# Patient Record
Sex: Male | Born: 1970 | Race: Black or African American | Hispanic: No | Marital: Single | Smoking: Never smoker
Health system: Southern US, Community
[De-identification: ages and names within clinical notes are randomized; demographics above are authoritative.]

## PROBLEM LIST (undated history)

## (undated) DIAGNOSIS — E669 Obesity, unspecified: Secondary | ICD-10-CM

## (undated) DIAGNOSIS — R011 Cardiac murmur, unspecified: Secondary | ICD-10-CM

---

## 2000-05-04 ENCOUNTER — Emergency Department (HOSPITAL_COMMUNITY): Admission: EM | Admit: 2000-05-04 | Discharge: 2000-05-05 | Payer: Self-pay

## 2000-05-04 ENCOUNTER — Encounter: Payer: Self-pay | Admitting: Emergency Medicine

## 2001-07-07 ENCOUNTER — Emergency Department (HOSPITAL_COMMUNITY): Admission: EM | Admit: 2001-07-07 | Discharge: 2001-07-07 | Payer: Self-pay | Admitting: Emergency Medicine

## 2001-11-04 ENCOUNTER — Encounter: Payer: Self-pay | Admitting: Emergency Medicine

## 2001-11-04 ENCOUNTER — Emergency Department (HOSPITAL_COMMUNITY): Admission: EM | Admit: 2001-11-04 | Discharge: 2001-11-04 | Payer: Self-pay | Admitting: Emergency Medicine

## 2002-07-29 ENCOUNTER — Emergency Department (HOSPITAL_COMMUNITY): Admission: EM | Admit: 2002-07-29 | Discharge: 2002-07-30 | Payer: Self-pay | Admitting: Emergency Medicine

## 2002-07-29 ENCOUNTER — Encounter: Payer: Self-pay | Admitting: Emergency Medicine

## 2002-07-30 ENCOUNTER — Encounter: Payer: Self-pay | Admitting: Emergency Medicine

## 2003-07-30 ENCOUNTER — Emergency Department (HOSPITAL_COMMUNITY): Admission: EM | Admit: 2003-07-30 | Discharge: 2003-07-30 | Payer: Self-pay

## 2005-06-25 ENCOUNTER — Emergency Department (HOSPITAL_COMMUNITY): Admission: EM | Admit: 2005-06-25 | Discharge: 2005-06-25 | Payer: Self-pay | Admitting: Emergency Medicine

## 2007-04-06 ENCOUNTER — Emergency Department (HOSPITAL_COMMUNITY): Admission: EM | Admit: 2007-04-06 | Discharge: 2007-04-06 | Payer: Self-pay | Admitting: Emergency Medicine

## 2007-09-07 ENCOUNTER — Emergency Department (HOSPITAL_COMMUNITY): Admission: EM | Admit: 2007-09-07 | Discharge: 2007-09-08 | Payer: Self-pay | Admitting: Emergency Medicine

## 2007-09-08 ENCOUNTER — Emergency Department (HOSPITAL_COMMUNITY): Admission: EM | Admit: 2007-09-08 | Discharge: 2007-09-09 | Payer: Self-pay | Admitting: Emergency Medicine

## 2010-08-13 ENCOUNTER — Emergency Department (HOSPITAL_COMMUNITY)
Admission: EM | Admit: 2010-08-13 | Discharge: 2010-08-13 | Disposition: A | Discharge status: Home / Self Care | Payer: Self-pay | Attending: Emergency Medicine | Admitting: Emergency Medicine

## 2010-08-13 ENCOUNTER — Emergency Department (HOSPITAL_COMMUNITY): Payer: Self-pay

## 2010-08-13 DIAGNOSIS — M25569 Pain in unspecified knee: Secondary | ICD-10-CM | POA: Insufficient documentation

## 2010-08-13 DIAGNOSIS — M25469 Effusion, unspecified knee: Secondary | ICD-10-CM | POA: Insufficient documentation

## 2011-01-15 LAB — COMPREHENSIVE METABOLIC PANEL
ALT: 29
Albumin: 3.1 — ABNORMAL LOW
Alkaline Phosphatase: 69
Alkaline Phosphatase: 75
BUN: 10
BUN: 11
CO2: 24
CO2: 29
Chloride: 101
Chloride: 105
Creatinine, Ser: 1.17
GFR calc non Af Amer: >60
Glucose, Bld: 109 — ABNORMAL HIGH
Glucose, Bld: 134 — ABNORMAL HIGH
Potassium: 3.6
Potassium: 3.9
Sodium: 133 — ABNORMAL LOW
Total Bilirubin: 1.2
Total Bilirubin: 1.3 — ABNORMAL HIGH

## 2011-01-15 LAB — CBC
HCT: 40.8
HCT: 44.3
Hemoglobin: 13.6
Hemoglobin: 14.4
MCHC: 33.4
Platelets: 178
RBC: 5.52
RDW: 13.9
WBC: 10.5

## 2011-01-15 LAB — DIFFERENTIAL
Basophils Absolute: 0
Basophils Absolute: 0.1
Basophils Relative: 0
Basophils Relative: 1
Eosinophils Absolute: 0.3
Eosinophils Relative: 4
Lymphocytes Relative: 23
Monocytes Absolute: 0.7
Neutro Abs: 7.1
Neutrophils Relative %: 67

## 2011-01-15 LAB — URINALYSIS, ROUTINE W REFLEX MICROSCOPIC
Bilirubin Urine: NEGATIVE
Bilirubin Urine: NEGATIVE
Glucose, UA: NEGATIVE
Ketones, ur: NEGATIVE
Ketones, ur: NEGATIVE
Nitrite: NEGATIVE
Nitrite: NEGATIVE
Protein, ur: NEGATIVE
Protein, ur: NEGATIVE
Urobilinogen, UA: 1
pH: 5.5

## 2011-01-15 LAB — D-DIMER, QUANTITATIVE: D-Dimer, Quant: 0.62 — ABNORMAL HIGH

## 2011-01-15 LAB — URINE MICROSCOPIC-ADD ON

## 2011-07-23 ENCOUNTER — Emergency Department (HOSPITAL_COMMUNITY)
Admission: EM | Admit: 2011-07-23 | Discharge: 2011-07-23 | Disposition: A | Discharge status: Home / Self Care | Payer: Self-pay | Attending: Emergency Medicine | Admitting: Emergency Medicine

## 2011-07-23 ENCOUNTER — Emergency Department (HOSPITAL_COMMUNITY): Payer: Self-pay

## 2011-07-23 ENCOUNTER — Encounter (HOSPITAL_COMMUNITY): Payer: Self-pay | Admitting: Emergency Medicine

## 2011-07-23 DIAGNOSIS — M1710 Unilateral primary osteoarthritis, unspecified knee: Secondary | ICD-10-CM | POA: Insufficient documentation

## 2011-07-23 DIAGNOSIS — IMO0002 Reserved for concepts with insufficient information to code with codable children: Secondary | ICD-10-CM | POA: Insufficient documentation

## 2011-07-23 DIAGNOSIS — G8929 Other chronic pain: Secondary | ICD-10-CM | POA: Insufficient documentation

## 2011-07-23 DIAGNOSIS — M25462 Effusion, left knee: Secondary | ICD-10-CM

## 2011-07-23 DIAGNOSIS — M25469 Effusion, unspecified knee: Secondary | ICD-10-CM | POA: Insufficient documentation

## 2011-07-23 DIAGNOSIS — M25569 Pain in unspecified knee: Secondary | ICD-10-CM | POA: Insufficient documentation

## 2011-07-23 HISTORY — DX: Obesity, unspecified: E66.9

## 2011-07-23 MED ORDER — MELOXICAM 7.5 MG PO TABS: 7.5000 mg | ORAL_TABLET | Freq: Every day | ORAL | Status: DC
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
## 2011-07-23 NOTE — Progress Notes (Signed)
Orthopedic Tech Progress Note Patient Details:  Ian Sparks [facility] [birth year] [id number]  Other Ortho Devices Type of Ortho Device: Knee Sleeve Ortho Device Interventions: Application   Cammer, Mickie Bail 07/23/2011, 10:22 AM

## 2011-07-23 NOTE — ED Notes (Signed)
Ortho tech paged  

## 2011-07-23 NOTE — Discharge Instructions (Signed)
Please read the information below.  Follow the RICE instructions detailed below.  If you continue to have problems with your knees, please call the orthopedist listed above.  You may also use the resources below to find a primary care provider for follow up.  You may return to the ER at any time for worsening condition or any new symptoms that concern you.  Arthralgia Arthralgia is joint pain. A joint is a place where two bones meet. Joint pain can happen for many reasons. The joint can be bruised, stiff, infected, or weak from aging. Pain usually goes away after resting and taking medicine for soreness.  HOME CARE  Rest the joint as told by your doctor.   Keep the sore joint raised (elevated) for the first 24 hours.   Put ice on the joint area.   Put ice in a plastic bag.   Place a towel between your skin and the bag.   Leave the ice on for 15 to 20 minutes, 3 to 4 times a day.   Wear your splint, casting, elastic bandage, or sling as told by your doctor.   Only take medicine as told by your doctor. Do not take aspirin.   Use crutches as told by your doctor. Do not put weight on the joint until told to by your doctor.  GET HELP RIGHT AWAY IF:   You have bruising, puffiness (swelling), or more pain.   Your fingers or toes turn blue or start to lose feeling (numb).   Your medicine does not lessen the pain.   Your pain becomes severe.   You have a temperature by mouth above 102 F (38.9 C), not controlled by medicine.   You cannot move or use the joint.  MAKE SURE YOU:   Understand these instructions.   Will watch your condition.   Will get help right away if you are not doing well or get worse.  Document Released: 03/26/2009 Document Revised: 03/27/2011 Document Reviewed: 03/26/2009 [facility] Patient Information 2012 [place], [place].  Arthritis, Nonspecific Arthritis is inflammation of a joint. This usually means pain, redness, warmth or swelling are present. One or more  joints may be involved. There are a number of types of arthritis. Your caregiver may not be able to tell what type of arthritis you have right away. CAUSES  The most common cause of arthritis is the wear and tear on the joint (osteoarthritis). This causes damage to the cartilage, which can break down over time. The knees, hips, back and neck are most often affected by this type of arthritis. Other types of arthritis and common causes of joint pain include:  Sprains and other injuries near the joint. Sometimes minor sprains and injuries cause pain and swelling that develop hours later.   Rheumatoid arthritis. This affects hands, feet and knees. It usually affects both sides of your body at the same time. It is often associated with chronic ailments, fever, weight loss and general weakness.   Crystal arthritis. Gout and pseudo gout can cause occasional acute severe pain, redness and swelling in the foot, ankle, or knee.   Infectious arthritis. Bacteria can get into a joint through a break in overlying skin. This can cause infection of the joint. Bacteria and viruses can also spread through the blood and affect your joints.   Drug, infectious and allergy reactions. Sometimes joints can become mildly painful and slightly swollen with these types of illnesses.  SYMPTOMS   Pain is the main symptom.  Your joint or joints can also be red, swollen and warm or hot to the touch.   You may have a fever with certain types of arthritis, or even feel overall ill.   The joint with arthritis will hurt with movement. Stiffness is present with some types of arthritis.  DIAGNOSIS  Your caregiver will suspect arthritis based on your description of your symptoms and on your exam. Testing may be needed to find the type of arthritis:  Blood and sometimes urine tests.   X-ray tests and sometimes CT or MRI scans.   Removal of fluid from the joint (arthrocentesis) is done to check for bacteria, crystals or other  causes. Your caregiver (or a specialist) will numb the area over the joint with a local anesthetic, and use a needle to remove joint fluid for examination. This procedure is only minimally uncomfortable.   Even with these tests, your caregiver may not be able to tell what kind of arthritis you have. Consultation with a specialist (rheumatologist) may be helpful.  TREATMENT  Your caregiver will discuss with you treatment specific to your type of arthritis. If the specific type cannot be determined, then the following general recommendations may apply. Treatment of severe joint pain includes:  Rest.   Elevation.   Anti-inflammatory medication (for example, ibuprofen) may be prescribed. Avoiding activities that cause increased pain.   Only take over-the-counter or prescription medicines for pain and discomfort as recommended by your caregiver.   Cold packs over an inflamed joint may be used for 10 to 15 minutes every hour. Hot packs sometimes feel better, but do not use overnight. Do not use hot packs if you are diabetic without your caregiver's permission.   A cortisone shot into arthritic joints may help reduce pain and swelling.   Any acute arthritis that gets worse over the next 1 to 2 days needs to be looked at to be sure there is no joint infection.  Long-term arthritis treatment involves modifying activities and lifestyle to reduce joint stress jarring. This can include weight loss. Also, exercise is needed to nourish the joint cartilage and remove waste. This helps keep the muscles around the joint strong. HOME CARE INSTRUCTIONS   Do not take aspirin to relieve pain if gout is suspected. This elevates uric acid levels.   Only take over-the-counter or prescription medicines for pain, discomfort or fever as directed by your caregiver.   Rest the joint as much as possible.   If your joint is swollen, keep it elevated.   Use crutches if the painful joint is in your leg.   Drinking  plenty of fluids may help for certain types of arthritis.   Follow your caregiver's dietary instructions.   Try low-impact exercise such as:   Swimming.   Water aerobics.   Biking.   Walking.   Morning stiffness is often relieved by a warm shower.   Put your joints through regular range-of-motion.  SEEK MEDICAL CARE IF:   You do not feel better in 24 hours or are getting worse.   You have side effects to medications, or are not getting better with treatment.  SEEK IMMEDIATE MEDICAL CARE IF:   You have a fever.   You develop severe joint pain, swelling or redness.   Many joints are involved and become painful and swollen.   There is severe back pain and/or leg weakness.   You have loss of bowel or bladder control.  Document Released: 05/15/2004 Document Revised: 03/27/2011 Document Reviewed: 05/31/2008  Exit[employer] Patient Information 2012 [place], [place].  If you have no primary doctor, here are some resources that may be helpful:  Medicaid-accepting [facility] Providers:   - [facility]- [address]      [phone]      Mon-Fri 9am-7pm, Sat 9am-1pm   - [facility]- [address] [place]      [phone]   - [facility]- [address], [address] [place]      [phone]   [facility]- [address]      [phone]   - Renaye Rakers- [address] [place], [address]      [phone]      Only accepts [place] patients       after they have her name applied to their card   Self Pay (no insurance) in [place]:   - Sickle Cell Patients: Dr Willey Blade, [facility] Internal Medicine      [address] [place]      [phone]   - Health [phone]   - Physician Referral Service- [phone]   - [facility]- [address] [place]      [phone]   [facility] [place]- [address]   - [facility]- see  information above      (Speak to [employer] if you do not have insurance)   - [facility]- [address] [place]      [phone]   - [facility] [place]- [address]      [phone]   - [facility]- [address]      [phone]   - Dr Julio Sicks-  [address], [place]      [phone]   - [facility]- [address]      [phone]   - [facility]- [address]      [phone]      Also [address]      [phone]   - [facility]- [address]      [phone]      1st and 3rd Saturday every month, 10am-1pm Other agencies that provide inexpensive medical care:    [facility]  [phone]    [facility]  [phone]    [facility]  [phone]    [facility]  [phone]    [facility]  [phone]  General Information: Finding a doctor when you do not have health insurance can be tricky. Although you are not limited by an insurance plan, you are of course limited by her finances and how much but he can pay out of pocket.  What are your options if you don't have health insurance?   1) Find a Librarian, academic and Pay Out of Pocket Although you won't have to find out who is covered by your insurance plan, it is a good idea to ask around and get recommendations. You will then need to call the office and see if the doctor you have chosen will accept you as a new patient and what types of options they offer for patients who are self-pay. Some doctors offer discounts or will set up payment plans for their patients who do not have insurance, but you will need to ask so you aren't  surprised when you get to your appointment.  2) Contact Your Local Health Department Not all health departments have doctors that can see patients for sick visits, but many do, so it is worth a call to see if yours does. If you don't know where your local health department is, you can check in your phone  book. The CDC also has a tool to help you locate your state's health department, and many state websites also have listings of all of their local health departments.  3) Find a Walk-in Clinic If your illness is not likely to be very severe or complicated, you may want to try a walk in clinic. These are popping up all over the country in pharmacies, drugstores, and shopping centers. They're usually staffed by nurse practitioners or physician assistants that have been trained to treat common illnesses and complaints. They're usually fairly quick and inexpensive. However, if you have serious medical issues or chronic medical problems, these are probably not your best option   RESOURCE GUIDE  Dental Problems  Patients with Medicaid: [facility] [phone] [address]                                           [phone] [address] Phone:  [phone]                                                  Phone:  [phone]  If unable to pay or uninsured, contact:  [facility] or [facility]. to become qualified for the adult dental clinic.  Chronic Pain Problems Contact [facility]  [phone] Patients need to be referred by their primary care doctor.  Insufficient Money for Medicine Contact [employer]:  call "211" or [facility] [phone].  No Primary Care Doctor Call [facility]  [phone] Other agencies that provide inexpensive medical care    [facility]  [phone]    [facility] Internal [facility]  [phone]    [facility]  [phone]    [facility]  [phone]    [facility]  [phone]    [facility]  [phone]  [facility] Behavioral Health  [phone] [facility]  [phone] [facility] Mental Health   [phone] (emergency services [phone])  Substance Abuse Resources [facility]  (763)098-0514 [facility] [phone] [facility] [place] 2518170494 Floydene Flock (949)080-1594 Residential & Outpatient Substance Abuse Program  684-864-1630  Abuse/Neglect [facility] Child Abuse Hotline (678)465-4669 [facility] Child Abuse Hotline 530-458-5450 (After Hours)  Emergency Shelter [facility] [phone]  [facility] [place] [facility] [place] 210-705-1214 [facility] (317)315-1835  MRSA Hotline #:   (802)780-5939    [facility] Resources  [facility] [place]     United Way                          [facility] Dept. [address] [place]  [address]      [address]  Blondell Reveal Phone:  [phone]                                   Phone:  [phone]                 Phone:  [phone]  [facility] Mental Health Phone:  [phone]  [facility] Child Abuse Hotline (872)518-1412 949-386-6692 (After Hours)  Arthritis, Nonspecific Arthritis is inflammation of a joint. This usually means pain, redness, warmth or swelling are present. One or more joints may be involved. There are a number of types of arthritis. Your caregiver may not be able to tell what type of arthritis you have right away. CAUSES  The most common cause of arthritis is the wear and tear on the joint (osteoarthritis). This causes damage to the cartilage, which can break down over time. The knees, hips, back and neck are most often affected by this type of arthritis. Other types of arthritis and common causes of joint pain include:  Sprains and other injuries near the joint. Sometimes minor sprains and injuries cause pain and swelling that develop hours later.   Rheumatoid arthritis. This affects hands, feet and knees. It usually affects both sides of your body at the same time. It is often associated with  chronic ailments, fever, weight loss and general weakness.   Crystal arthritis. Gout and pseudo gout can cause occasional acute severe pain, redness and swelling in the foot, ankle, or knee.   Infectious arthritis. Bacteria can get into a joint through a break in overlying skin. This can cause infection of the joint. Bacteria and viruses can also spread through the blood and affect your joints.   Drug, infectious and allergy reactions. Sometimes joints can become mildly painful and slightly swollen with these types of illnesses.  SYMPTOMS   Pain is the main symptom.   Your joint or joints can also be red, swollen and warm or hot to the touch.   You may have a fever with certain types of arthritis, or even feel overall ill.   The joint with arthritis will hurt with movement. Stiffness is present with some types of arthritis.  DIAGNOSIS  Your caregiver will suspect arthritis based on your description of your symptoms and on your exam. Testing may be needed to find the type of arthritis:  Blood and sometimes urine tests.   X-ray tests and sometimes CT or MRI scans.   Removal of fluid from the joint (arthrocentesis) is done to check for bacteria, crystals or other causes. Your caregiver (or a specialist) will numb the area over the joint with a local anesthetic, and use a needle to remove joint fluid for examination. This procedure is only minimally uncomfortable.   Even with these tests, your caregiver may not be able to tell what kind of arthritis you have. Consultation with a specialist (rheumatologist) may be helpful.  TREATMENT  Your caregiver will discuss with you treatment specific to your type of arthritis. If the specific type cannot be determined, then the following general recommendations may apply. Treatment of severe joint pain includes:  Rest.   Elevation.   Anti-inflammatory medication (for example, ibuprofen) may be prescribed. Avoiding activities that cause increased  pain.   Only take over-the-counter or prescription medicines for pain and discomfort as recommended by your caregiver.   Cold packs over an inflamed joint may be used for 10 to 15 minutes every hour. Hot packs sometimes feel better, but do not use overnight. Do not use hot packs if you are diabetic without your caregiver's permission.   A cortisone shot into arthritic joints may help reduce pain and swelling.   Any acute arthritis that gets worse over the next 1 to 2 days needs to be looked at to be sure there is no joint infection.  Long-term arthritis treatment involves modifying activities and lifestyle to reduce joint stress jarring. This can include weight loss. Also, exercise is needed to nourish the joint cartilage and remove waste. This helps keep the muscles around the joint strong. HOME CARE INSTRUCTIONS   Do not take aspirin to relieve pain if gout is suspected. This elevates uric acid levels.   Only take over-the-counter or prescription medicines for pain, discomfort or fever as directed by your caregiver.   Rest the joint as much as possible.   If your joint is swollen, keep it elevated.   Use crutches if the painful joint is in your leg.   Drinking plenty of fluids may help for certain types of arthritis.   Follow your caregiver's dietary instructions.   Try low-impact exercise such as:   Swimming.   Water aerobics.   Biking.   Walking.   Morning stiffness is often relieved by a warm shower.   Put your joints through regular range-of-motion.  SEEK MEDICAL CARE IF:   You do not feel better in 24 hours or are getting worse.   You have side effects to medications, or are not getting better with treatment.  SEEK IMMEDIATE MEDICAL CARE IF:   You have a fever.   You develop severe joint pain, swelling or redness.   Many joints are involved and become painful and swollen.   There is severe back pain and/or leg weakness.   You have loss of bowel or bladder  control.  Document Released: 05/15/2004 Document Revised: 03/27/2011 Document Reviewed: 05/31/2008 [facility] Patient Information 2012 [place], [place].

## 2011-07-23 NOTE — ED Provider Notes (Signed)
Medical screening examination/treatment/procedure(s) were performed by non-physician practitioner and as supervising physician I was immediately available for consultation/collaboration.  Ethelda Chick, MD 07/23/11 1054

## 2011-07-23 NOTE — ED Notes (Signed)
Left knee pain  X 1 1/2 months has not seen a dr hurts to walk and pain was bad last night no recent injury

## 2012-04-08 ENCOUNTER — Encounter (HOSPITAL_COMMUNITY): Payer: Self-pay | Admitting: *Deleted

## 2012-04-08 ENCOUNTER — Emergency Department (HOSPITAL_COMMUNITY)
Admission: EM | Admit: 2012-04-08 | Discharge: 2012-04-08 | Disposition: A | Discharge status: Home / Self Care | Payer: Self-pay | Attending: Emergency Medicine | Admitting: Emergency Medicine

## 2012-04-08 ENCOUNTER — Emergency Department (HOSPITAL_COMMUNITY): Payer: Self-pay

## 2012-04-08 DIAGNOSIS — G8929 Other chronic pain: Secondary | ICD-10-CM | POA: Insufficient documentation

## 2012-04-08 DIAGNOSIS — E669 Obesity, unspecified: Secondary | ICD-10-CM | POA: Insufficient documentation

## 2012-04-08 DIAGNOSIS — M25519 Pain in unspecified shoulder: Secondary | ICD-10-CM | POA: Insufficient documentation

## 2012-04-08 DIAGNOSIS — M25569 Pain in unspecified knee: Secondary | ICD-10-CM | POA: Insufficient documentation

## 2012-04-08 MED ORDER — HYDROMORPHONE HCL PF 1 MG/ML IJ SOLN
1.0000 mg | Freq: Once | INTRAMUSCULAR | Status: AC
  Administered 2012-04-08: 1 mg via INTRAMUSCULAR
  Filled 2012-04-08: qty 1

## 2012-04-08 MED ORDER — HYDROCODONE-ACETAMINOPHEN 5-325 MG PO TABS: 2.0000 | ORAL_TABLET | Freq: Once | ORAL | Status: DC

## 2012-04-08 MED ORDER — MELOXICAM 7.5 MG PO TABS: 7.5000 mg | ORAL_TABLET | Freq: Every day | ORAL | Status: DC

## 2012-04-08 NOTE — ED Notes (Signed)
Patient refusing Vicodin. States "Dilaudid only works for me". PA notified

## 2012-04-08 NOTE — ED Notes (Signed)
Pt fell 1.5 weeks ago, c/o right knee pain and right arm pain from fall.
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
## 2012-04-08 NOTE — Discharge Instructions (Signed)
Chronic Pain Management Managing chronic pain is not easy. The goal is to provide as much pain relief as possible. There are emotional as well as physical problems. Chronic pain may lead to symptoms of depression which magnify those of the pain. Problems may include:  Anxiety.  Sleep disturbances.  Confused thinking.  Feeling cranky.  Fatigue.  Weight gain or loss. Identify the source of the pain first, if possible. The pain may be masking another problem. Try to find a pain management specialist or clinic. Work with a team to create a treatment plan for you. MEDICATIONS  May include narcotics or opioids. Larger than normal doses may be needed to control your pain.  Drugs for depression may help.  Over-the-counter medicines may help for some conditions. These drugs may be used along with others for better pain relief.  May be injected into sites such as the spine and joints. Injections may have to be repeated if they wear off. THERAPY MAY INCLUDE:  Working with a physical therapist to keep from getting stiff.  Regular, gentle exercise.  Cognitive or behavioral therapy.  Using complementary or integrative medicine such as:  Acupuncture.  Massage, Reiki, or Rolfing.  Aroma, color, light, or sound therapy.  Group support. FOR MORE INFORMATION [email or web address]. American Chronic Pain Association [email or web address]. Document Released: 05/15/2004 Document Revised: 06/30/2011 Document Reviewed: 06/24/2007 [facility] Patient Information 2013 [place], [place].  Please take medications as directed.  Please follow up with a Primary Care Provider or Specialist as stated above.  If you do not have a primary care doctor, please see the list below.  Please return to the emergency department or follow up with your doctor if you have: - Worsening symptoms - Fever >102 - Chest Pain - Difficulty Breathing or Shortness of Breath  Affordable Health Care Below is  information to help you research and register for Affordable Health Care that was started on January 20, 2012. Keep in mind when comparing different insurance plan prices that you may qualify for for financial assistance to lower your costs  The same application will let you find out if you and your family members might qualify for free or low-cost coverage available through Medicaid or the [employer] Program (CHIP).  www.http://www.long-jenkins.com/: Where individuals can learn about the Marketplace and the upcoming benefits (including where they can find local assistance), or be connected to appropriate resources in states that are running their own Marketplace.  Health Insurance Marketplace Call Center: If you have questions, call ([phone]. TTY users should call [phone], Open 24 hours a day, 7 days a week.   ePompanoBeach.com.br: Where to get the latest resources to help spread the word about Affordable Health Care & the Marketplace. This website also has Health visitor in many different languages.    More information:   Health insurance companies selling plans through the Marketplace cant deny you coverage or charge you more due to pre-existing health conditions, and they cant charge women and men different premiums based on their gender.  The information is all available online, but you can apply 4 ways: online, by phone, by mail, or in-person with the help of a trained assister or navigator.   Each health plan will generally offer comprehensive coverage, including a core set of essential health benefits like doctor visits, preventive care, maternity care, hospitalization, prescription drugs, and more.   No matter where you live, there will be a Marketplace in your state, offering plans from private companies where youll be able to compare your  health coverage options based on price, benefits, quality, and other features important to you before you make a choice.    RESOURCE GUIDE  Chronic Pain Problems: Contact [facility]  [phone] Patients need to be referred by their primary care doctor.  Insufficient Money for Medicine: Contact United Way:  call "211."   No Primary Care Doctor: - Call Health Connect  [phone] - can help you locate a primary care doctor that  accepts your insurance, provides certain services, etc. - Physician Referral Service- [phone]  Agencies that provide inexpensive medical care: - [facility]  [phone] - [facility]  [phone] - [facility]  [phone] - Women's Clinic  [phone] - Planned Parenthood  [phone] - [facility]  [phone]  Medicaid-accepting [facility] Providers: - [facility]- [address]  [phone], Mon-Fri 9am-7pm, Sat 9am-1pm - [facility]- [address] [place], Suite [place]  [phone] - [facility]- [address], [address] [place]  [phone] [facility]- [address]  [phone] - Renaye Rakers- [address] [place], [address], [phone]  Only accepts [place] patients after they have their name  applied to their card  Self Pay (no insurance) in [place]: - Sickle Cell Patients: Dr Willey Blade, [facility] Internal Medicine  [address] [place], [phone] - [facility]- [address] [place]  [phone]       [facility] [place]- [address]       -     [facility]- see information above (Speak to [employer] if you do not have insurance)       -  [facility]- [address] [place],  [phone]       -  [facility]- [address], [phone]       -  Dr Julio Sicks-  [address], [place], [phone]       -  Urgent Medical and [facility] - [address] [phone]       -  [facility]- [address]  [phone], also [address] [phone]       -    [facility]- [address] [place], [phone], 1st & 3rd Saturday        every month, 10am-1pm  1) Find a Doctor and Pay Out of Pocket Although you won't have to find out who is covered by your insurance plan, it is a good idea to ask around and get recommendations. You will then need to call the office and see if the doctor you have chosen will accept you as a new patient and what types of options they offer for patients who are self-pay. Some doctors offer discounts or will set up payment plans for their patients who do not have insurance, but you will need to ask so you aren't surprised when you get to your appointment.  2) Contact Your Local Health Department Not all health departments have doctors that can see patients for sick visits, but many do, so it is worth a call to see if yours does. If you don't know where your local health department is, you can check in your phone book. The CDC also has a tool to help you locate your state's  health department, and many state websites also have listings of all of their local health departments.  3) Find a Walk-in Clinic If your illness is not likely to be very severe or complicated, you may want to try a walk in clinic. These are popping up all over the country in pharmacies, drugstores, and shopping centers. They're usually staffed by nurse practitioners or physician assistants that have been trained to treat common illnesses and complaints. They're usually fairly quick and inexpensive. However, if you have serious medical issues or chronic medical problems, these are probably not your best option  STD Testing - [facility] [place], STD Clinic, [address], [place], phone [phone] or [phone].  Monday - Friday, call for an appointment. [facility] [employer], STD Clinic, [address], [place], phone [phone] or [phone].  Monday - Friday, call for an appointment.  Abuse/Neglect: [facility] Child Abuse Hotline 602-372-7111 [facility] Child Abuse Hotline (832)116-6861 (After Hours)  Emergency Shelter:  [facility] 712-631-5334  Maternity Homes: - Room at the [place] [facility] (774)169-6184 - [facility] [phone]  MRSA Hotline #:   (873)113-0215  [facility] Resources [facility] [place]  United Way [facility] Dept. [address]                 [address]         [address] [place] [address]  Blondell Reveal Phone:  [phone]                                  Phone:  [phone]                   Phone:  [phone]  [facility], [phone] - [facility] - [facility] [place]- [phone]       -     [facility] in [place], [address],             [phone], Insurance  [place] Child Abuse Hotline (431)226-9701 or 2264478716 (After Hours)  Dental Assistance  If unable to pay or uninsured, contact:  [facility] to become qualified for the adult dental clinic.  Patients with Medicaid: [facility] [phone] [address], [phone] [address] [phone]  If unable to pay, or uninsured, contact [facility] [phone] in [place], [phone] in [facility]) to become qualified for the adult dental clinic  Other Low-Cost Community Dental Services: - [facility]- [address] [place], [place], [place], [address], [phone], Ext. [address], 2nd and 4th Thursday of the month at 6:30am.  10 clients each day by appointment, can sometimes see walk-in patients if someone does not show for an appointment. [facility]- [address] [place]  [place], [place], [address], [phone] -  [facility]- [address], [place], [place], [address], [phone] [facility] Health Department- [phone] [facility] Health Department- [phone] [facility] Health [phone]       Behavioral Health Resources in the [facility]  Intensive Outpatient Programs: [facility]      [address] [place], [place] [phone] Both a day and evening program       [facility] Outpatient     [address]        [place], [place] [address] [phone]         ADS: [facility] Drug S[facility] [address] [place] [phone]  [facility] Mental Health ACCESS LINE: [phone] or [phone] [address] [place], [place] [address] [email or web address].[email or web address]  Behavioral Health Services  Substance Abuse Resources: - [facility] Drug Services  [phone] - [facility] [phone] - [facility] [place] [phone] Floydene Flock [phone] - Residential & Outpatient Substance Abuse Program  ([phone]  Psychological Services: [facility]  [phone] [facility]  [phone] - [facility], [phone] [place]. [address] [place], ACCESS LINE: 5157485199 or (808)[phone], EntrepreneurLoan.[email or web address].za  Mobile Crisis Teams:                                        Therapeutic Alternatives         Mobile Crisis Care Unit [phone]             [facility] [address] Ginette Otto [phone]                                         Interventionist [address] [place] [place] [phone]  Self-Help/Support Groups: Mental Health Assoc. of [employer] of support groups 980-129-9577 (call for more info)   Narcotics Anonymous (NA) Caring Services [address] [place] - 2 meetings  at this location  Residential Treatment Programs:  [facility]      [address]        [place]       [phone]         [facility] [address] [place] [address] [place], [place]  [address] [phone]  [facility]  [address] [place], [place] [address] [phone] Admissions: 8am-3pm M-F  [facility]     [address]        [place], [place] [address]       [phone]         [facility] [address] [place], [place] [phone]  The [facility] [address] [place], [place] [phone]  [facility] - Intensive Outpatient      [address]     [place], [place]       [phone]         [facility] (Addiction Recovery Care [facility]     [address] [place], [place] [phone] or [phone]  Residential Treatment Services (RTS), Medicaid [address] [place], [place] [phone]  [facility] Margo Aye  [address] [place] [phone]  [facility] Resources: [place]- [phone]               General Therapy                                                Angie Fava, PhD        [address] [place], [place] [address]         [phone]   Insurance  [facility] Behavioral   [address] [place], [place] [address] [phone]  [facility] [address] [place], [place] [address] [phone] Insurance/Medicaid/sponsorship through [facility] and Families                                              [address]                                        [place], [place] [address]    Therapy/tele-psych/case         [phone]          [facility] [address], [place]  [address]  Adolescent/group home/case management [phone]                                            Creola Corn PhD       General therapy       Insurance   [phone]         Dr. Lolly Mustache, Insurance, M-F [phone]

## 2012-04-08 NOTE — ED Provider Notes (Signed)
History/physical exam/procedure(s) were performed by non-physician practitioner and as supervising physician I was immediately available for consultation/collaboration. I have reviewed all notes and am in agreement with care and plan.   Hilario Quarry, MD 04/08/12 [phone]

## 2012-08-12 ENCOUNTER — Encounter (HOSPITAL_COMMUNITY): Payer: Self-pay | Admitting: Emergency Medicine

## 2012-08-12 ENCOUNTER — Emergency Department (HOSPITAL_COMMUNITY): Payer: Self-pay

## 2012-08-12 DIAGNOSIS — J029 Acute pharyngitis, unspecified: Secondary | ICD-10-CM | POA: Insufficient documentation

## 2012-08-12 DIAGNOSIS — R05 Cough: Secondary | ICD-10-CM | POA: Insufficient documentation

## 2012-08-12 DIAGNOSIS — R011 Cardiac murmur, unspecified: Secondary | ICD-10-CM | POA: Insufficient documentation

## 2012-08-12 DIAGNOSIS — R059 Cough, unspecified: Secondary | ICD-10-CM | POA: Insufficient documentation

## 2012-08-12 DIAGNOSIS — E669 Obesity, unspecified: Secondary | ICD-10-CM | POA: Insufficient documentation

## 2012-08-12 DIAGNOSIS — R52 Pain, unspecified: Secondary | ICD-10-CM | POA: Insufficient documentation

## 2012-08-12 DIAGNOSIS — R509 Fever, unspecified: Secondary | ICD-10-CM | POA: Insufficient documentation

## 2012-08-12 LAB — COMPREHENSIVE METABOLIC PANEL
ALT: 20 U/L (ref 0–53)
Albumin: 3.3 g/dL — ABNORMAL LOW (ref 3.5–5.2)
Alkaline Phosphatase: 74 U/L (ref 39–117)
Potassium: 3.7 mEq/L (ref 3.5–5.1)
Sodium: 138 mEq/L (ref 135–145)
Total Protein: 7.5 g/dL (ref 6.0–8.3)

## 2012-08-12 LAB — URINALYSIS, ROUTINE W REFLEX MICROSCOPIC
Bilirubin Urine: NEGATIVE
Glucose, UA: NEGATIVE mg/dL
Ketones, ur: NEGATIVE mg/dL
Leukocytes, UA: NEGATIVE
pH: 7 (ref 5.0–8.0)

## 2012-08-12 LAB — CBC WITH DIFFERENTIAL/PLATELET
Basophils Absolute: 0 10*3/uL (ref 0.0–0.1)
Basophils Relative: 0 % (ref 0–1)
Eosinophils Absolute: 0.1 10*3/uL (ref 0.0–0.7)
Hemoglobin: 13.5 g/dL (ref 13.0–17.0)
MCH: 27.1 pg (ref 26.0–34.0)
MCHC: 33.9 g/dL (ref 30.0–36.0)
Neutrophils Relative %: 70 % (ref 43–77)
Platelets: 132 10*3/uL — ABNORMAL LOW (ref 150–400)
WBC: 6.5 10*3/uL (ref 4.0–10.5)

## 2012-08-12 MED ORDER — ACETAMINOPHEN 325 MG PO TABS
650.0000 mg | ORAL_TABLET | Freq: Once | ORAL | Status: AC
  Administered 2012-08-12: 650 mg via ORAL
  Filled 2012-08-12: qty 2

## 2012-08-12 NOTE — ED Notes (Signed)
PT. REPORTS FEVER WITH CHILLS, BODY ACHES AND OCCASIONAL DRY COUGH ONSET YESTERDAY .

## 2012-08-13 ENCOUNTER — Emergency Department (HOSPITAL_COMMUNITY)
Admission: EM | Admit: 2012-08-13 | Discharge: 2012-08-13 | Disposition: A | Discharge status: Home / Self Care | Payer: Self-pay | Attending: Emergency Medicine | Admitting: Emergency Medicine

## 2012-08-13 HISTORY — DX: Cardiac murmur, unspecified: R01.1

## 2012-08-13 MED ORDER — KETOROLAC TROMETHAMINE 60 MG/2ML IM SOLN
60.0000 mg | Freq: Once | INTRAMUSCULAR | Status: AC
  Administered 2012-08-13: 60 mg via INTRAMUSCULAR
  Filled 2012-08-13: qty 2

## 2012-08-13 MED ORDER — BENZONATATE 100 MG PO CAPS: 200.0000 mg | ORAL_CAPSULE | Freq: Three times a day (TID) | ORAL | Status: DC

## 2012-08-13 MED ORDER — NAPROXEN 500 MG PO TABS: 500.0000 mg | ORAL_TABLET | Freq: Two times a day (BID) | ORAL | Status: DC

## 2012-08-13 MED ORDER — HYDROCODONE-ACETAMINOPHEN 5-325 MG PO TABS: 1.0000 | ORAL_TABLET | Freq: Four times a day (QID) | ORAL | Status: DC | PRN

## 2012-08-13 MED ORDER — BENZONATATE 100 MG PO CAPS
200.0000 mg | ORAL_CAPSULE | Freq: Once | ORAL | Status: AC
  Administered 2012-08-13: 200 mg via ORAL
  Filled 2012-08-13: qty 2

## 2012-08-16 NOTE — ED Provider Notes (Signed)
History     CSN: [id number]  Arrival date & time 08/12/12  2249   First MD Initiated Contact with Patient 08/13/12 [phone]      Chief Complaint  Patient presents with  . Fever  . Generalized Body Aches    HPI Ian Sparks is a 42 y.o. male presenting with 1 day of fever,dry cough, sore throat, and acute onset body aches yesterday.  Pt did not have a flu shot this year.  Feels symptoms have been constant and not relieved with OTC medicine. No allergies, no chest pain, no abdominal pain, nausea, vomiting, diarrhea, neck pain or headache.  Past Medical History  Diagnosis Date  . Obesity   . Heart murmur     History reviewed. No pertinent past surgical history.  No family history on file.  History  Substance Use Topics  . Smoking status: Never Smoker   . Smokeless tobacco: Not on file  . Alcohol Use: Yes      Review of Systems At least 10pt or greater review of systems completed and are negative except where specified in the HPI.  Allergies  Aspirin  Home Medications   Current Outpatient Rx  Name  Route  Sig  Dispense  Refill  . ibuprofen (ADVIL,MOTRIN) 200 MG tablet   Oral   Take 200 mg by mouth every 6 (six) hours as needed for pain.         . benzonatate (TESSALON) 100 MG capsule   Oral   Take 2 capsules (200 mg total) by mouth every 8 (eight) hours.   21 capsule   0   . HYDROcodone-acetaminophen (NORCO/VICODIN) 5-325 MG per tablet   Oral   Take 1-2 tablets by mouth every 6 (six) hours as needed for pain.   7 tablet   0   . naproxen (NAPROSYN) 500 MG tablet   Oral   Take 1 tablet (500 mg total) by mouth 2 (two) times daily.   30 tablet   0     BP 105/63  Pulse 75  Temp(Src) 102.9 F (39.4 C) (Oral)  Resp 20  SpO2 96%  Physical Exam  Nursing notes reviewed.  Electronic medical record reviewed. VITAL SIGNS:   Filed Vitals:   08/12/12 2257 08/13/12 0217  BP: 138/80 105/63  Pulse: 85 75  Temp: 102.9 F (39.4 C)   TempSrc: Oral    Resp: 14 20  SpO2: 96% 96%   CONSTITUTIONAL: Awake, oriented, appears non-toxic HENT: Atraumatic, normocephalic, oral mucosa pink and moist, airway patent.  Mild pharyngeal erythema, no edema or exudate. Nares patent without drainage. External ears normal. EYES: Conjunctiva clear, EOMI, PERRLA NECK: Trachea midline, non-tender, supple CARDIOVASCULAR: Normal heart rate, Normal rhythm, No murmurs, rubs, gallops PULMONARY/CHEST: Clear to auscultation, no rhonchi, wheezes, or rales. Symmetrical breath sounds. Non-tender. ABDOMINAL: Non-distended, soft, non-tender - no rebound or guarding.  BS normal. NEUROLOGIC: Non-focal, moving all four extremities, no gross sensory or motor deficits. EXTREMITIES: No clubbing, cyanosis, or edema SKIN: Warm, Dry, No erythema, No rash ED Course  Procedures (including critical care time)  Labs Reviewed  CBC WITH DIFFERENTIAL - Abnormal; Notable for the following:    Platelets 132 (*)    Monocytes Relative 14 (*)    All other components within normal limits  COMPREHENSIVE METABOLIC PANEL - Abnormal; Notable for the following:    Glucose, Bld 132 (*)    Albumin 3.3 (*)    Total Bilirubin 1.6 (*)    All other components within normal limits  URINALYSIS, ROUTINE W REFLEX MICROSCOPIC - Abnormal; Notable for the following:    Urobilinogen, UA >8.0 (*)    All other components within normal limits   No results found.   1. Influenza-like illness     Medications  acetaminophen (TYLENOL) tablet 650 mg (650 mg Oral Given 08/12/12 2318)  ketorolac (TORADOL) injection 60 mg (60 mg Intramuscular Given 08/13/12 0135)  benzonatate (TESSALON) capsule 200 mg (200 mg Oral Given 08/13/12 0136)    MDM  Pt w/ likely influenza or ILI, treate symptomatically, doubt ACS, PTX, PNA - lungs clear, labs and CXR obtained via protocol - no indication for further studies.  Pt had slightly elevated Bili and urob in urine, no h/o iver disease, no EtOH history, doesn't know of  Gilbert's history.  Plt 132, but MCV WNL - doubt EtOH - likely viral illness.  Symptomatic treatment.  I explained the diagnosis and have given explicit precautions to return to the ER including  or any other new or worsening symptoms. The patient understands and accepts the medical plan as it's been dictated and I have answered their questions. Discharge instructions concerning home care and prescriptions have been given.  The patient is STABLE and is discharged to home in good condition.         Jones Skene, MD 08/16/12 1153

## 2013-02-20 ENCOUNTER — Encounter (HOSPITAL_COMMUNITY): Payer: Self-pay | Admitting: Emergency Medicine

## 2013-02-20 ENCOUNTER — Emergency Department (HOSPITAL_COMMUNITY)
Admission: EM | Admit: 2013-02-20 | Discharge: 2013-02-20 | Disposition: A | Discharge status: Home / Self Care | Payer: Self-pay | Attending: Emergency Medicine | Admitting: Emergency Medicine

## 2013-02-20 ENCOUNTER — Emergency Department (HOSPITAL_COMMUNITY): Payer: Self-pay

## 2013-02-20 DIAGNOSIS — E669 Obesity, unspecified: Secondary | ICD-10-CM | POA: Insufficient documentation

## 2013-02-20 DIAGNOSIS — Z888 Allergy status to other drugs, medicaments and biological substances status: Secondary | ICD-10-CM | POA: Insufficient documentation

## 2013-02-20 DIAGNOSIS — R011 Cardiac murmur, unspecified: Secondary | ICD-10-CM | POA: Insufficient documentation

## 2013-02-20 DIAGNOSIS — M25532 Pain in left wrist: Secondary | ICD-10-CM

## 2013-02-20 DIAGNOSIS — M25539 Pain in unspecified wrist: Secondary | ICD-10-CM | POA: Insufficient documentation

## 2013-02-20 MED ORDER — OXYCODONE-ACETAMINOPHEN 5-325 MG PO TABS
2.0000 | ORAL_TABLET | Freq: Once | ORAL | Status: AC
  Administered 2013-02-20: 2 via ORAL
  Filled 2013-02-20: qty 2

## 2013-02-20 MED ORDER — OXYCODONE-ACETAMINOPHEN 5-325 MG PO TABS: 2.0000 | ORAL_TABLET | ORAL | Status: DC | PRN

## 2013-02-20 NOTE — ED Provider Notes (Signed)
CSN: [id number]     Arrival date & time 02/20/13  2121 History   First MD Initiated Contact with Patient 02/20/13 2153     This chart was scribed for Ian Sparks, by Ladona Ridgel Day, ED scribe. This patient was seen in room TR05C/TR05C and the patient's care was started at 2153.  Chief Complaint  Patient presents with  . Wrist Pain   Patient is a 42 y.o. male presenting with wrist pain. The history is provided by the patient. No language interpreter was used.  Wrist Pain This is a new problem. The current episode started more than 1 week ago. The problem occurs constantly. The problem has been gradually worsening. Pertinent negatives include no chest pain, no abdominal pain, no headaches and no shortness of breath. The symptoms are aggravated by bending. Nothing relieves the symptoms. He has tried a cold compress and rest for the symptoms. The treatment provided no relief.   HPI Comments: Ian Sparks is a 42 y.o. male who presents to the Emergency Department complaining of sudden, constant left wrist pain, onset 2 months ago when he tightened his wrist to make a fist and heard a popping noise with sudden pain from his left wrist. He states thought the pain would improve but it has worsened since his injury. He has not yet seen anyone for this problem. He states no relief with rest, ice or compression of his wrist. He states good sensation/movement throughout his fingers.   Past Medical History  Diagnosis Date  . Obesity   . Heart murmur    No past surgical history on file. No family history on file. History  Substance Use Topics  . Smoking status: Never Smoker   . Smokeless tobacco: Not on file  . Alcohol Use: Yes    Review of Systems  Constitutional: Negative for fever and chills.  Respiratory: Negative for shortness of breath.   Cardiovascular: Negative for chest pain.  Gastrointestinal: Negative for nausea, vomiting and abdominal pain.  Musculoskeletal:       Left wrist  pain  Neurological: Negative for weakness and headaches.  All other systems reviewed and are negative.   A complete 10 system review of systems was obtained and all systems are negative except as noted in the HPI and PMH.   Allergies  Aspirin  Home Medications  No current outpatient prescriptions on file. BP 150/80  Pulse 82  Temp(Src) 98.1 F (36.7 C) (Oral)  Resp 18  Wt 384 lb 1.6 oz (174.227 kg)  SpO2 97% Physical Exam  Nursing note and vitals reviewed. Constitutional: He is oriented to person, place, and time. He appears well-developed and well-nourished. No distress.  HENT:  Head: Normocephalic and atraumatic.  Eyes: EOM are normal.  Neck: Neck supple. No tracheal deviation present.  Cardiovascular: Normal rate.   Pulmonary/Chest: Effort normal. No respiratory distress.  Musculoskeletal: Normal range of motion.  Neurological: He is alert and oriented to person, place, and time.  Skin: Skin is warm and dry.  Psychiatric: He has a normal mood and affect. His behavior is normal.    ED Course  Procedures (including critical care time) DIAGNOSTIC STUDIES: Oxygen Saturation is 97% on room air, normal by my interpretation.    COORDINATION OF CARE: At 1000 PM Discussed treatment plan with patient which includes percocet/roxicet, left wrist x-ray, compression wrap. Patient agrees.   Labs Review Labs Reviewed - No data to display Imaging Review Dg Wrist Complete Left  02/20/2013   CLINICAL DATA:  Left-sided  wrist pain.  EXAM: LEFT WRIST - COMPLETE 3+ VIEW  COMPARISON:  No priors.  FINDINGS: Multiple views of the left wrist demonstrate no acute displaced fracture, subluxation, dislocation, or soft tissue abnormality.  IMPRESSION: No acute radiographic abnormality of the left wrist.   Electronically Signed   By: Trudie Reed M.D.   On: 02/20/2013 21:55    EKG Interpretation   None       MDM   1. Wrist pain, left    Wrist pain, wrapped in ace wrap and arm placed  in sling. Has been having intermittent wrist pain since the end of August. X-ray without abnormalities. ROM limited due to pain. +flexion/extension, wrist pain with palpation. Follow-up with Ortho. Discussed with pt and he agrees.    I personally performed the services described in this documentation, which was scribed in my presence. The recorded information has been reviewed and is accurate.]     Irish Elders, NP 02/21/13 [phone]
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
## 2013-02-21 NOTE — ED Provider Notes (Signed)
Medical screening examination/treatment/procedure(s) were performed by non-physician practitioner and as supervising physician I was immediately available for consultation/collaboration.  EKG Interpretation   None         Shanna Cisco, MD 02/21/13 1514

## 2013-04-06 ENCOUNTER — Ambulatory Visit: Payer: Self-pay

## 2013-06-07 ENCOUNTER — Encounter (HOSPITAL_COMMUNITY): Payer: Self-pay | Admitting: Emergency Medicine

## 2013-06-07 ENCOUNTER — Emergency Department (HOSPITAL_COMMUNITY)
Admission: EM | Admit: 2013-06-07 | Discharge: 2013-06-07 | Disposition: A | Discharge status: Home / Self Care | Payer: Self-pay | Attending: Emergency Medicine | Admitting: Emergency Medicine

## 2013-06-07 DIAGNOSIS — R011 Cardiac murmur, unspecified: Secondary | ICD-10-CM | POA: Insufficient documentation

## 2013-06-07 DIAGNOSIS — E669 Obesity, unspecified: Secondary | ICD-10-CM | POA: Insufficient documentation

## 2013-06-07 DIAGNOSIS — K089 Disorder of teeth and supporting structures, unspecified: Secondary | ICD-10-CM | POA: Insufficient documentation

## 2013-06-07 DIAGNOSIS — K0889 Other specified disorders of teeth and supporting structures: Secondary | ICD-10-CM

## 2013-06-07 DIAGNOSIS — K029 Dental caries, unspecified: Secondary | ICD-10-CM | POA: Insufficient documentation

## 2013-06-07 MED ORDER — AMOXICILLIN 500 MG PO CAPS: 500.0000 mg | ORAL_CAPSULE | Freq: Three times a day (TID) | ORAL | Status: DC

## 2013-06-07 MED ORDER — OXYCODONE-ACETAMINOPHEN 5-325 MG PO TABS: 1.0000 | ORAL_TABLET | Freq: Four times a day (QID) | ORAL | Status: DC | PRN

## 2013-06-07 NOTE — ED Notes (Signed)
Pt c/o left upper tooth pain that started Friday. Pt stated that there is bleeding around his gum that started Sunday.

## 2013-06-07 NOTE — ED Provider Notes (Signed)
CSN: [id number]     Arrival date & time 06/07/13  1533 History   None    Chief Complaint  Patient presents with  . Dental Pain     (Consider location/radiation/quality/duration/timing/severity/associated sxs/prior Treatment) HPI  Patient presents to the emergency department with a dental complaint. Symptoms began on Friday, 5 days ago. The patient has tried to alleviate pain with OTC medications.  Pain rated at a 10/10, says Dilaudid is the only thing that helps his pain. characterized as throbbing in nature and located left upper molar. Patient denies fever, night sweats, chills, difficulty swallowing or opening mouth, SOB, nuchal rigidity or decreased ROM of neck.  Patient does not have a dentist and requests a resource guide at discharge.   Past Medical History  Diagnosis Date  . Obesity   . Heart murmur    History reviewed. No pertinent past surgical history. No family history on file. History  Substance Use Topics  . Smoking status: Never Smoker   . Smokeless tobacco: Not on file  . Alcohol Use: Yes     Comment: occasional     Review of Systems  The patient denies anorexia, fever, weight loss,, vision loss, decreased hearing, hoarseness, chest pain, syncope, dyspnea on exertion, peripheral edema, balance deficits, hemoptysis, abdominal pain, melena, hematochezia, severe indigestion/heartburn, hematuria, incontinence, genital sores, muscle weakness, suspicious skin lesions, transient blindness, difficulty walking, depression, unusual weight change, abnormal bleeding, enlarged lymph nodes, angioedema, and breast masses.   Allergies  Aspirin  Home Medications   Current Outpatient Rx  Name  Route  Sig  Dispense  Refill  . oxyCODONE-acetaminophen (PERCOCET/ROXICET) 5-325 MG per tablet   Oral   Take 2 tablets by mouth every 4 (four) hours as needed for pain.   15 tablet   0   . amoxicillin (AMOXIL) 500 MG capsule   Oral   Take 1 capsule (500 mg total) by mouth 3  (three) times daily.   21 capsule   0   . oxyCODONE-acetaminophen (PERCOCET/ROXICET) 5-325 MG per tablet   Oral   Take 1-2 tablets by mouth every 6 (six) hours as needed for severe pain.   25 tablet   0    BP 125/80  Pulse 62  Temp(Src) 98.6 F (37 C) (Oral)  Resp 18  SpO2 98% Physical Exam  Nursing note and vitals reviewed. Constitutional: He appears well-developed and well-nourished.  HENT:  Head: Normocephalic and atraumatic.  Mouth/Throat: Dental caries present.    Eyes: Conjunctivae and EOM are normal. Pupils are equal, round, and reactive to light.  Neck: Normal range of motion. Neck supple.  Cardiovascular: Normal rate and regular rhythm.   Pulmonary/Chest: Effort normal and breath sounds normal.    ED Course  Procedures (including critical care time) Labs Review Labs Reviewed - No data to display Imaging Review No results found.  EKG Interpretation   None       MDM   Final diagnoses:  Pain, dental    Patient has dental pain. No emergent s/sx's present. Patent airway. No trismus.  Will be given pain medication and antibiotics. I discussed the need to call dentist within 24/48 hours for follow-up. Dental referral given. Return to ED precautions given.  Pt voiced understanding and has agreed to follow-up.   43 y.o.Ian Sparks's evaluation in the Emergency Department is complete. It has been determined that no acute conditions requiring further emergency intervention are present at this time. The patient/guardian have been advised of the diagnosis and plan. We  have discussed signs and symptoms that warrant return to the ED, such as changes or worsening in symptoms.  Vital signs are stable at discharge. Filed Vitals:   06/07/13 1546  BP: 125/80  Pulse: 62  Temp: 98.6 F (37 C)  Resp: 18    Patient/guardian has voiced understanding and agreed to follow-up with the PCP or specialist.     Dorthula Matas, PA-C 06/07/13 1751

## 2013-06-07 NOTE — Discharge Instructions (Signed)

## 2013-06-07 NOTE — ED Provider Notes (Signed)
Medical screening examination/treatment/procedure(s) were performed by non-physician practitioner and as supervising physician I was immediately available for consultation/collaboration.  Tonique Mendonca L Valleri Hendricksen, MD 06/07/13 2355 

## 2014-04-12 ENCOUNTER — Encounter (HOSPITAL_COMMUNITY): Payer: Self-pay | Admitting: *Deleted

## 2014-04-12 ENCOUNTER — Emergency Department (HOSPITAL_COMMUNITY)
Admission: EM | Admit: 2014-04-12 | Discharge: 2014-04-12 | Disposition: A | Discharge status: Home / Self Care | Payer: Self-pay | Attending: Emergency Medicine | Admitting: Emergency Medicine

## 2014-04-12 DIAGNOSIS — M545 Low back pain, unspecified: Secondary | ICD-10-CM

## 2014-04-12 DIAGNOSIS — Y9389 Activity, other specified: Secondary | ICD-10-CM | POA: Insufficient documentation

## 2014-04-12 DIAGNOSIS — Z792 Long term (current) use of antibiotics: Secondary | ICD-10-CM | POA: Insufficient documentation

## 2014-04-12 DIAGNOSIS — S39012A Strain of muscle, fascia and tendon of lower back, initial encounter: Secondary | ICD-10-CM | POA: Insufficient documentation

## 2014-04-12 DIAGNOSIS — E669 Obesity, unspecified: Secondary | ICD-10-CM | POA: Insufficient documentation

## 2014-04-12 DIAGNOSIS — Y998 Other external cause status: Secondary | ICD-10-CM | POA: Insufficient documentation

## 2014-04-12 DIAGNOSIS — Y92410 Unspecified street and highway as the place of occurrence of the external cause: Secondary | ICD-10-CM | POA: Insufficient documentation

## 2014-04-12 DIAGNOSIS — R011 Cardiac murmur, unspecified: Secondary | ICD-10-CM | POA: Insufficient documentation

## 2014-04-12 MED ORDER — CYCLOBENZAPRINE HCL 10 MG PO TABS
10.0000 mg | ORAL_TABLET | Freq: Once | ORAL | Status: AC
Start: 2014-04-12 — End: 2014-04-12
  Administered 2014-04-12: 10 mg via ORAL
  Filled 2014-04-12: qty 1

## 2014-04-12 MED ORDER — IBUPROFEN 800 MG PO TABS: 800.0000 mg | ORAL_TABLET | Freq: Three times a day (TID) | ORAL | Status: DC

## 2014-04-12 MED ORDER — OXYCODONE-ACETAMINOPHEN 5-325 MG PO TABS
1.0000 | ORAL_TABLET | Freq: Once | ORAL | Status: AC
  Administered 2014-04-12: 1 via ORAL
  Filled 2014-04-12: qty 1

## 2014-04-12 MED ORDER — CYCLOBENZAPRINE HCL 10 MG PO TABS: 10.0000 mg | ORAL_TABLET | Freq: Three times a day (TID) | ORAL | Status: DC

## 2014-04-12 MED ORDER — IBUPROFEN 400 MG PO TABS
800.0000 mg | ORAL_TABLET | Freq: Once | ORAL | Status: AC
  Administered 2014-04-12: 800 mg via ORAL
  Filled 2014-04-12: qty 2

## 2014-04-12 MED ORDER — OXYCODONE-ACETAMINOPHEN 5-325 MG PO TABS: 1.0000 | ORAL_TABLET | ORAL | Status: DC | PRN

## 2014-04-12 NOTE — ED Notes (Signed)
pts vital signs updated pt awaiting discharge paperwork at bedside.  

## 2014-04-12 NOTE — Discharge Instructions (Signed)
Muscle Strain °A muscle strain is an injury that occurs when a muscle is stretched beyond its normal length. Usually a small number of muscle fibers are torn when this happens. Muscle strain is rated in degrees. First-degree strains have the least amount of muscle fiber tearing and pain. Second-degree and third-degree strains have increasingly more tearing and pain.  °Usually, recovery from muscle strain takes 1-2 weeks. Complete healing takes 5-6 weeks.  °CAUSES  °Muscle strain happens when a sudden, violent force placed on a muscle stretches it too far. This may occur with lifting, sports, or a fall.  °RISK FACTORS °Muscle strain is especially common in athletes.  °SIGNS AND SYMPTOMS °At the site of the muscle strain, there may be: °· Pain. °· Bruising. °· Swelling. °· Difficulty using the muscle due to pain or lack of normal function. °DIAGNOSIS  °Your health care provider will perform a physical exam and ask about your medical history. °TREATMENT  °Often, the best treatment for a muscle strain is resting, icing, and applying cold compresses to the injured area.   °HOME CARE INSTRUCTIONS  °· Use the PRICE method of treatment to promote muscle healing during the first 2-3 days after your injury. The PRICE method involves: °¨ Protecting the muscle from being injured again. °¨ Restricting your activity and resting the injured body part. °¨ Icing your injury. To do this, put ice in a plastic bag. Place a towel between your skin and the bag. Then, apply the ice and leave it on from 15-20 minutes each hour. After the third day, switch to moist heat packs. °¨ Apply compression to the injured area with a splint or elastic bandage. Be careful not to wrap it too tightly. This may interfere with blood circulation or increase swelling. °¨ Elevate the injured body part above the level of your heart as often as you can. °· Only take over-the-counter or prescription medicines for pain, discomfort, or fever as directed by your  health care provider. °· Warming up prior to exercise helps to prevent future muscle strains. °SEEK MEDICAL CARE IF:  °· You have increasing pain or swelling in the injured area. °· You have numbness, tingling, or a significant loss of strength in the injured area. °MAKE SURE YOU:  °· Understand these instructions. °· Will watch your condition. °· Will get help right away if you are not doing well or get worse. °Document Released: 04/07/2005 Document Revised: 01/26/2013 Document Reviewed: 11/04/2012 °Exit[employer]® Patient Information ©2015 [employer]. This information is not intended to replace advice given to you by your health care provider. Make sure you discuss any questions you have with your health care provider. °Cryotherapy °Cryotherapy means treatment with cold. Ice or gel packs can be used to reduce both pain and swelling. Ice is the most helpful within the first 24 to 48 hours after an injury or flare-up from overusing a muscle or joint. Sprains, strains, spasms, burning pain, shooting pain, and aches can all be eased with ice. Ice can also be used when recovering from surgery. Ice is effective, has very few side effects, and is safe for most people to use. °PRECAUTIONS  °Ice is not a safe treatment option for people with: °· Raynaud phenomenon. This is a condition affecting small blood vessels in the extremities. Exposure to cold may cause your problems to return. °· Cold hypersensitivity. There are many forms of cold hypersensitivity, including: °· Cold urticaria. Red, itchy hives appear on the skin when the tissues begin to warm after being   iced. °· Cold erythema. This is a red, itchy rash caused by exposure to cold. °· Cold hemoglobinuria. Red blood cells break down when the tissues begin to warm after being iced. The hemoglobin that carry oxygen are passed into the urine because they cannot combine with blood proteins fast enough. °· Numbness or altered sensitivity in the area being iced. °If you have  any of the following conditions, do not use ice until you have discussed cryotherapy with your caregiver: °· Heart conditions, such as arrhythmia, angina, or chronic heart disease. °· High blood pressure. °· Healing wounds or open skin in the area being iced. °· Current infections. °· Rheumatoid arthritis. °· Poor circulation. °· Diabetes. °Ice slows the blood flow in the region it is applied. This is beneficial when trying to stop inflamed tissues from spreading irritating chemicals to surrounding tissues. However, if you expose your skin to cold temperatures for too long or without the proper protection, you can damage your skin or nerves. Watch for signs of skin damage due to cold. °HOME CARE INSTRUCTIONS °Follow these tips to use ice and cold packs safely. °· Place a dry or damp towel between the ice and skin. A damp towel will cool the skin more quickly, so you may need to shorten the time that the ice is used. °· For a more rapid response, add gentle compression to the ice. °· Ice for no more than 10 to 20 minutes at a time. The bonier the area you are icing, the less time it will take to get the benefits of ice. °· Check your skin after 5 minutes to make sure there are no signs of a poor response to cold or skin damage. °· Rest 20 minutes or more between uses. °· Once your skin is numb, you can end your treatment. You can test numbness by very lightly touching your skin. The touch should be so light that you do not see the skin dimple from the pressure of your fingertip. When using ice, most people will feel these normal sensations in this order: cold, burning, aching, and numbness. °· Do not use ice on someone who cannot communicate their responses to pain, such as small children or people with dementia. °HOW TO MAKE AN ICE PACK °Ice packs are the most common way to use ice therapy. Other methods include ice massage, ice baths, and cryosprays. Muscle creams that cause a cold, tingly feeling do not offer the  same benefits that ice offers and should not be used as a substitute unless recommended by your caregiver. °To make an ice pack, do one of the following: °· Place crushed ice or a bag of frozen vegetables in a sealable plastic bag. Squeeze out the excess air. Place this bag inside another plastic bag. Slide the bag into a pillowcase or place a damp towel between your skin and the bag. °· Mix 3 parts water with 1 part rubbing alcohol. Freeze the mixture in a sealable plastic bag. When you remove the mixture from the freezer, it will be slushy. Squeeze out the excess air. Place this bag inside another plastic bag. Slide the bag into a pillowcase or place a damp towel between your skin and the bag. °SEEK MEDICAL CARE IF: °· You develop white spots on your skin. This may give the skin a blotchy (mottled) appearance. °· Your skin turns blue or pale. °· Your skin becomes waxy or hard. °· Your swelling gets worse. °MAKE SURE YOU:  °· Understand these instructions. °·   Will watch your condition. °· Will get help right away if you are not doing well or get worse. °Document Released: 12/02/2010 Document Revised: 08/22/2013 Document Reviewed: 12/02/2010 °ExitCare® Patient Information ©2015 [employer]. This information is not intended to replace advice given to you by your health care provider. Make sure you discuss any questions you have with your health care provider. °Motor Vehicle Collision °It is common to have multiple bruises and sore muscles after a motor vehicle collision (MVC). These tend to feel worse for the first 24 hours. You may have the most stiffness and soreness over the first several hours. You may also feel worse when you wake up the first morning after your collision. After this point, you will usually begin to improve with each day. The speed of improvement often depends on the severity of the collision, the number of injuries, and the location and nature of these injuries. °HOME CARE INSTRUCTIONS °· Put  ice on the injured area. °· Put ice in a plastic bag. °· Place a towel between your skin and the bag. °· Leave the ice on for 15-20 minutes, 3-4 times a day, or as directed by your health care provider. °· Drink enough fluids to keep your urine clear or pale yellow. Do not drink alcohol. °· Take a warm shower or bath once or twice a day. This will increase blood flow to sore muscles. °· You may return to activities as directed by your caregiver. Be careful when lifting, as this may aggravate neck or back pain. °· Only take over-the-counter or prescription medicines for pain, discomfort, or fever as directed by your caregiver. Do not use aspirin. This may increase bruising and bleeding. °SEEK IMMEDIATE MEDICAL CARE IF: °· You have numbness, tingling, or weakness in the arms or legs. °· You develop severe headaches not relieved with medicine. °· You have severe neck pain, especially tenderness in the middle of the back of your neck. °· You have changes in bowel or bladder control. °· There is increasing pain in any area of the body. °· You have shortness of breath, light-headedness, dizziness, or fainting. °· You have chest pain. °· You feel sick to your stomach (nauseous), throw up (vomit), or sweat. °· You have increasing abdominal discomfort. °· There is blood in your urine, stool, or vomit. °· You have pain in your shoulder (shoulder strap areas). °· You feel your symptoms are getting worse. °MAKE SURE YOU: °· Understand these instructions. °· Will watch your condition. °· Will get help right away if you are not doing well or get worse. °Document Released: 04/07/2005 Document Revised: 08/22/2013 Document Reviewed: 09/04/2010 °ExitCare® Patient Information ©2015 [employer]. This information is not intended to replace advice given to you by your health care provider. Make sure you discuss any questions you have with your health care provider. ° °
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
## 2014-07-13 ENCOUNTER — Encounter (HOSPITAL_COMMUNITY): Payer: Self-pay | Admitting: Emergency Medicine

## 2014-07-13 ENCOUNTER — Emergency Department (HOSPITAL_COMMUNITY)
Admission: EM | Admit: 2014-07-13 | Discharge: 2014-07-13 | Disposition: A | Discharge status: Home / Self Care | Payer: Self-pay | Attending: Emergency Medicine | Admitting: Emergency Medicine

## 2014-07-13 DIAGNOSIS — R059 Cough, unspecified: Secondary | ICD-10-CM

## 2014-07-13 DIAGNOSIS — Z792 Long term (current) use of antibiotics: Secondary | ICD-10-CM | POA: Insufficient documentation

## 2014-07-13 DIAGNOSIS — M79674 Pain in right toe(s): Secondary | ICD-10-CM | POA: Insufficient documentation

## 2014-07-13 DIAGNOSIS — Z79899 Other long term (current) drug therapy: Secondary | ICD-10-CM | POA: Insufficient documentation

## 2014-07-13 DIAGNOSIS — E669 Obesity, unspecified: Secondary | ICD-10-CM | POA: Insufficient documentation

## 2014-07-13 DIAGNOSIS — Z791 Long term (current) use of non-steroidal anti-inflammatories (NSAID): Secondary | ICD-10-CM | POA: Insufficient documentation

## 2014-07-13 DIAGNOSIS — R011 Cardiac murmur, unspecified: Secondary | ICD-10-CM | POA: Insufficient documentation

## 2014-07-13 DIAGNOSIS — R062 Wheezing: Secondary | ICD-10-CM | POA: Insufficient documentation

## 2014-07-13 DIAGNOSIS — R05 Cough: Secondary | ICD-10-CM | POA: Insufficient documentation

## 2014-07-13 MED ORDER — OXYCODONE-ACETAMINOPHEN 5-325 MG PO TABS
2.0000 | ORAL_TABLET | Freq: Once | ORAL | Status: AC
  Administered 2014-07-13: 2 via ORAL
  Filled 2014-07-13: qty 2

## 2014-07-13 MED ORDER — OXYCODONE-ACETAMINOPHEN 5-325 MG PO TABS: 1.0000 | ORAL_TABLET | ORAL | Status: DC | PRN

## 2014-07-13 MED ORDER — AZITHROMYCIN 250 MG PO TABS: 250.0000 mg | ORAL_TABLET | Freq: Every day | ORAL | Status: DC

## 2014-07-13 MED ORDER — INDOMETHACIN 25 MG PO CAPS: 25.0000 mg | ORAL_CAPSULE | Freq: Three times a day (TID) | ORAL | Status: DC | PRN

## 2014-07-13 NOTE — Discharge Instructions (Signed)
Take the prescribed medication as directed. °Follow-up with the [facility]. °Return to the ED for new or worsening symptoms. ° °

## 2014-07-13 NOTE — ED Notes (Signed)
Patient states that he has pain in his right foot that started yesterday suddenly.  Patient does have some swelling to right foot and right great toe.  Pain when walking.  CSMT's intact. Pulses intact.

## 2014-07-13 NOTE — ED Provider Notes (Signed)
CSN: [id number]     Arrival date & time 07/13/14  [id number] History   First MD Initiated Contact with Patient 07/13/14 0630     Chief Complaint  Patient presents with  . Foot Pain     (Consider location/radiation/quality/duration/timing/severity/associated sxs/prior Treatment) Patient is a 44 y.o. male presenting with lower extremity pain. The history is provided by the patient and medical records.  Foot Pain Associated symptoms include arthralgias and coughing.   This is a 44 year old male with past medical history significant for obesity, presenting to the ED for right great toe pain. Patient states pain began yesterday and has been progressively worsening. He states he was barely able to sleep last night due to the pain.  He denies injury or trauma to foot.  He has no known hx of gout but has been eating a lot of red meat recently.  Patient also notes a productive cough and subjective fever.  He has recently been visiting family member who was admitted to the hospital for CAP so he has had numerous sick contacts.  He denies chest pain or SOB.  He has no hx of asthma, COPD, and is not a daily smoker.  VSS.  Past Medical History  Diagnosis Date  . Obesity   . Heart murmur    History reviewed. No pertinent past surgical history. No family history on file. History  Substance Use Topics  . Smoking status: Never Smoker   . Smokeless tobacco: Not on file  . Alcohol Use: Yes     Comment: occasional     Review of Systems  Respiratory: Positive for cough.   Musculoskeletal: Positive for arthralgias.  All other systems reviewed and are negative.     Allergies  Aspirin  Home Medications   Prior to Admission medications   Medication Sig Start Date End Date Taking? Authorizing Provider  amoxicillin (AMOXIL) 500 MG capsule Take 1 capsule (500 mg total) by mouth 3 (three) times daily. 06/07/13   Tiffany Neva SeatGreene, PA-C  cyclobenzaprine (FLEXERIL) 10 MG tablet Take 1 tablet (10 mg total) by  mouth 3 (three) times daily. 04/12/14   Elpidio AnisShari Upstill, PA-C  ibuprofen (ADVIL,MOTRIN) 800 MG tablet Take 1 tablet (800 mg total) by mouth 3 (three) times daily. 04/12/14   Elpidio AnisShari Upstill, PA-C  oxyCODONE-acetaminophen (PERCOCET/ROXICET) 5-325 MG per tablet Take 1-2 tablets by mouth every 4 (four) hours as needed for severe pain. 04/12/14   Shari Upstill, PA-C   BP 117/68 mmHg  Pulse 63  Temp(Src) 98.6 F (37 C) (Oral)  Resp 20  Ht 6\' 2"  (1.88 m)  SpO2 96%   Physical Exam  Constitutional: He is oriented to person, place, and time. He appears well-developed and well-nourished.  HENT:  Head: Normocephalic and atraumatic.  Mouth/Throat: Oropharynx is clear and moist.  Eyes: Conjunctivae and EOM are normal. Pupils are equal, round, and reactive to light.  Neck: Normal range of motion.  Cardiovascular: Normal rate, regular rhythm and normal heart sounds.   Pulmonary/Chest: Effort normal. He has wheezes.  Faint wheeze noted to left upper lobe, no distress, O2 sats 96% on RA  Abdominal: Soft. Bowel sounds are normal.  Musculoskeletal: Normal range of motion.  Right great toe swollen and erythematous; toe is exquisitely TTP without noted deformity; limited ROM secondary to pain; normal DP pulse and cap refill; sensation intact throughout foot  Neurological: He is alert and oriented to person, place, and time.  Skin: Skin is warm and dry.  Psychiatric: He has a normal mood  and affect.  Nursing note and vitals reviewed.   ED Course  Procedures (including critical care time) Labs Review Labs Reviewed - No data to display  Imaging Review No results found.   EKG Interpretation None      MDM   Final diagnoses:  Great toe pain, right  Cough   44 year old male with right great toe pain for the past 24 hours. On exam, toe is swollen, erythematous, and exquisitely tender to palpation. He has no noted history of gout but has recently been eating red meat. Suspect this is due to gout, no  concern for septic joint. Patient also with cough and subjective fever. He has been exposed to multiple hospitalized patients who are sick with CAP. On exam he does have faint wheezes of his left upper lobe but his oxygen saturation is stable on room air.  He is afebrile and non-toxic in appearance.  Will cover with abx given his recent contact with hospitalized patients.  No chest pain or SOB.  Rx azithromycin, indomethacin, and percocet.  Patient given FU with wellness clinic.  Discussed plan with patient, he/she acknowledged understanding and agreed with plan of care.  Return precautions given for new or worsening symptoms.  Garlon Hatchet, PA-C 07/13/14 6045  Marisa Severin, MD 07/20/14 [phone]

## 2014-11-08 ENCOUNTER — Emergency Department (HOSPITAL_COMMUNITY): Payer: Self-pay

## 2014-11-08 ENCOUNTER — Encounter (HOSPITAL_COMMUNITY): Payer: Self-pay

## 2014-11-08 ENCOUNTER — Other Ambulatory Visit: Payer: Self-pay

## 2014-11-08 ENCOUNTER — Emergency Department (HOSPITAL_COMMUNITY)
Admission: EM | Admit: 2014-11-08 | Discharge: 2014-11-08 | Disposition: A | Discharge status: Home / Self Care | Payer: Self-pay | Attending: Emergency Medicine | Admitting: Emergency Medicine

## 2014-11-08 DIAGNOSIS — Z791 Long term (current) use of non-steroidal anti-inflammatories (NSAID): Secondary | ICD-10-CM | POA: Insufficient documentation

## 2014-11-08 DIAGNOSIS — E669 Obesity, unspecified: Secondary | ICD-10-CM | POA: Insufficient documentation

## 2014-11-08 DIAGNOSIS — R200 Anesthesia of skin: Secondary | ICD-10-CM | POA: Insufficient documentation

## 2014-11-08 DIAGNOSIS — M549 Dorsalgia, unspecified: Secondary | ICD-10-CM

## 2014-11-08 DIAGNOSIS — M546 Pain in thoracic spine: Secondary | ICD-10-CM | POA: Insufficient documentation

## 2014-11-08 DIAGNOSIS — K029 Dental caries, unspecified: Secondary | ICD-10-CM | POA: Insufficient documentation

## 2014-11-08 DIAGNOSIS — K088 Other specified disorders of teeth and supporting structures: Secondary | ICD-10-CM | POA: Insufficient documentation

## 2014-11-08 DIAGNOSIS — Z792 Long term (current) use of antibiotics: Secondary | ICD-10-CM | POA: Insufficient documentation

## 2014-11-08 DIAGNOSIS — R011 Cardiac murmur, unspecified: Secondary | ICD-10-CM | POA: Insufficient documentation

## 2014-11-08 DIAGNOSIS — Z79899 Other long term (current) drug therapy: Secondary | ICD-10-CM | POA: Insufficient documentation

## 2014-11-08 LAB — CBC
HEMATOCRIT: 46 % (ref 39.0–52.0)
Hemoglobin: 14.8 g/dL (ref 13.0–17.0)
MCH: 26.6 pg (ref 26.0–34.0)
MCHC: 32.2 g/dL (ref 30.0–36.0)
MCV: 82.6 fL (ref 78.0–100.0)
PLATELETS: 134 10*3/uL — AB (ref 150–400)
RBC: 5.57 MIL/uL (ref 4.22–5.81)
RDW: 13.1 % (ref 11.5–15.5)
WBC: 8.9 10*3/uL (ref 4.0–10.5)

## 2014-11-08 LAB — COMPREHENSIVE METABOLIC PANEL
ALBUMIN: 3.6 g/dL (ref 3.5–5.0)
ALK PHOS: 79 U/L (ref 38–126)
ALT: 19 U/L (ref 17–63)
ANION GAP: 8 (ref 5–15)
AST: 23 U/L (ref 15–41)
BILIRUBIN TOTAL: 1.2 mg/dL (ref 0.3–1.2)
BUN: 9 mg/dL (ref 6–20)
CO2: 26 mmol/L (ref 22–32)
Calcium: 9.1 mg/dL (ref 8.9–10.3)
Chloride: 105 mmol/L (ref 101–111)
Creatinine, Ser: 1.08 mg/dL (ref 0.61–1.24)
GFR calc non Af Amer: >60 mL/min (ref >60)
Glucose, Bld: 110 mg/dL — ABNORMAL HIGH (ref 65–99)
Potassium: 4.2 mmol/L (ref 3.5–5.1)
SODIUM: 139 mmol/L (ref 135–145)
Total Protein: 8.2 g/dL — ABNORMAL HIGH (ref 6.5–8.1)

## 2014-11-08 LAB — I-STAT TROPONIN, ED: TROPONIN I, POC: 0.01 ng/mL (ref 0.00–0.08)

## 2014-11-08 MED ORDER — HYDROMORPHONE HCL 1 MG/ML IJ SOLN
1.0000 mg | Freq: Once | INTRAMUSCULAR | Status: AC
  Administered 2014-11-08: 1 mg via INTRAVENOUS
  Filled 2014-11-08: qty 1

## 2014-11-08 MED ORDER — PENICILLIN V POTASSIUM 500 MG PO TABS: 500.0000 mg | ORAL_TABLET | Freq: Four times a day (QID) | ORAL | Status: DC

## 2014-11-08 MED ORDER — HYDROMORPHONE HCL 1 MG/ML IJ SOLN
0.5000 mg | Freq: Once | INTRAMUSCULAR | Status: AC
Start: 2014-11-08 — End: 2014-11-08
  Administered 2014-11-08: 0.5 mg via INTRAVENOUS
  Filled 2014-11-08: qty 1

## 2014-11-08 MED ORDER — DIAZEPAM 5 MG/ML IJ SOLN
2.5000 mg | Freq: Once | INTRAMUSCULAR | Status: AC
  Administered 2014-11-08: 2.5 mg via INTRAVENOUS
  Filled 2014-11-08: qty 2

## 2014-11-08 MED ORDER — PREDNISONE 20 MG PO TABS: 40.0000 mg | ORAL_TABLET | Freq: Every day | ORAL | Status: DC

## 2014-11-08 MED ORDER — CYCLOBENZAPRINE HCL 10 MG PO TABS: 10.0000 mg | ORAL_TABLET | Freq: Two times a day (BID) | ORAL | Status: DC | PRN

## 2014-11-08 MED ORDER — TRAMADOL HCL 50 MG PO TABS: 50.0000 mg | ORAL_TABLET | Freq: Four times a day (QID) | ORAL | Status: DC | PRN
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
## 2014-11-08 NOTE — ED Notes (Signed)
Pt returned from xray
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
## 2014-11-08 NOTE — Discharge Instructions (Signed)
Dental Pain A tooth ache may be caused by cavities (tooth decay). Cavities expose the nerve of the tooth to air and hot or cold temperatures. It may come from an infection or abscess (also called a boil or furuncle) around your tooth. It is also often caused by dental caries (tooth decay). This causes the pain you are having. DIAGNOSIS  Your caregiver can diagnose this problem by exam. TREATMENT   If caused by an infection, it may be treated with medications which kill germs (antibiotics) and pain medications as prescribed by your caregiver. Take medications as directed.  Only take over-the-counter or prescription medicines for pain, discomfort, or fever as directed by your caregiver.  Whether the tooth ache today is caused by infection or dental disease, you should see your dentist as soon as possible for further care. SEEK MEDICAL CARE IF: The exam and treatment you received today has been provided on an emergency basis only. This is not a substitute for complete medical or dental care. If your problem worsens or new problems (symptoms) appear, and you are unable to meet with your dentist, call or return to this location. SEEK IMMEDIATE MEDICAL CARE IF:   You have a fever.  You develop redness and swelling of your face, jaw, or neck.  You are unable to open your mouth.  You have severe pain uncontrolled by pain medicine. MAKE SURE YOU:   Understand these instructions.  Will watch your condition.  Will get help right away if you are not doing well or get worse. Document Released: 04/07/2005 Document Revised: 06/30/2011 Document Reviewed: 11/24/2007 [facility] Patient Information 2015 [place]. This information is not intended to replace advice given to you by your health care provider. Make sure you discuss any questions you have with your health care provider.  Musculoskeletal Pain Musculoskeletal pain is muscle and boney aches and pains. These pains can occur in any part of  the body. Your caregiver may treat you without knowing the cause of the pain. They may treat you if blood or urine tests, X-rays, and other tests were normal.  CAUSES There is often not a definite cause or reason for these pains. These pains may be caused by a type of germ (virus). The discomfort may also come from overuse. Overuse includes working out too hard when your body is not fit. Boney aches also come from weather changes. Bone is sensitive to atmospheric pressure changes. HOME CARE INSTRUCTIONS   Ask when your test results will be ready. Make sure you get your test results.  Only take over-the-counter or prescription medicines for pain, discomfort, or fever as directed by your caregiver. If you were given medications for your condition, do not drive, operate machinery or power tools, or sign legal documents for 24 hours. Do not drink alcohol. Do not take sleeping pills or other medications that may interfere with treatment.  Continue all activities unless the activities cause more pain. When the pain lessens, slowly resume normal activities. Gradually increase the intensity and duration of the activities or exercise.  During periods of severe pain, bed rest may be helpful. Lay or sit in any position that is comfortable.  Putting ice on the injured area.  Put ice in a bag.  Place a towel between your skin and the bag.  Leave the ice on for 15 to 20 minutes, 3 to 4 times a day.  Follow up with your caregiver for continued problems and no reason can be found for the pain. If  the pain becomes worse or does not go away, it may be necessary to repeat tests or do additional testing. Your caregiver may need to look further for a possible cause. SEEK IMMEDIATE MEDICAL CARE IF:  You have pain that is getting worse and is not relieved by medications.  You develop chest pain that is associated with shortness or breath, sweating, feeling sick to your stomach (nauseous), or throw up  (vomit).  Your pain becomes localized to the abdomen.  You develop any new symptoms that seem different or that concern you. MAKE SURE YOU:   Understand these instructions.  Will watch your condition.  Will get help right away if you are not doing well or get worse. Document Released: 04/07/2005 Document Revised: 06/30/2011 Document Reviewed: 12/10/2012 [facility] Patient Information 2015 [place], [place]. This information is not intended to replace advice given to you by your health care provider. Make sure you discuss any questions you have with your health care provider.  Back Pain, Adult Low back pain is very common. About 1 in 5 people have back pain.The cause of low back pain is rarely dangerous. The pain often gets better over time.About half of people with a sudden onset of back pain feel better in just 2 weeks. About 8 in 10 people feel better by 6 weeks.  CAUSES Some common causes of back pain include:  Strain of the muscles or ligaments supporting the spine.  Wear and tear (degeneration) of the spinal discs.  Arthritis.  Direct injury to the back. DIAGNOSIS Most of the time, the direct cause of low back pain is not known.However, back pain can be treated effectively even when the exact cause of the pain is unknown.Answering your caregiver's questions about your overall health and symptoms is one of the most accurate ways to make sure the cause of your pain is not dangerous. If your caregiver needs more information, he or she may order lab work or imaging tests (X-rays or MRIs).However, even if imaging tests show changes in your back, this usually does not require surgery. HOME CARE INSTRUCTIONS For many people, back pain returns.Since low back pain is rarely dangerous, it is often a condition that people can learn to [facility] their own.   Remain active. It is stressful on the back to sit or stand in one place. Do not sit, drive, or stand in one place for more than 30  minutes at a time. Take short walks on level surfaces as soon as pain allows.Try to increase the length of time you walk each day.  Do not stay in bed.Resting more than 1 or 2 days can delay your recovery.  Do not avoid exercise or work.Your body is made to move.It is not dangerous to be active, even though your back may hurt.Your back will likely heal faster if you return to being active before your pain is gone.  Pay attention to your body when you bend and lift. Many people have less discomfortwhen lifting if they bend their knees, keep the load close to their bodies,and avoid twisting. Often, the most comfortable positions are those that put less stress on your recovering back.  Find a comfortable position to sleep. Use a firm mattress and lie on your side with your knees slightly bent. If you lie on your back, put a pillow under your knees.  Only take over-the-counter or prescription medicines as directed by your caregiver. Over-the-counter medicines to reduce pain and inflammation are often the most helpful.Your caregiver may prescribe  muscle relaxant drugs.These medicines help dull your pain so you can more quickly return to your normal activities and healthy exercise.  Put ice on the injured area.  Put ice in a plastic bag.  Place a towel between your skin and the bag.  Leave the ice on for 15-20 minutes, 03-04 times a day for the first 2 to 3 days. After that, ice and heat may be alternated to reduce pain and spasms.  Ask your caregiver about trying back exercises and gentle massage. This may be of some benefit.  Avoid feeling anxious or stressed.Stress increases muscle tension and can worsen back pain.It is important to recognize when you are anxious or stressed and learn ways to manage it.Exercise is a great option. SEEK MEDICAL CARE IF:  You have pain that is not relieved with rest or medicine.  You have pain that does not improve in 1 week.  You have new  symptoms.  You are generally not feeling well. SEEK IMMEDIATE MEDICAL CARE IF:   You have pain that radiates from your back into your legs.  You develop new bowel or bladder control problems.  You have unusual weakness or numbness in your arms or legs.  You develop nausea or vomiting.  You develop abdominal pain.  You feel faint. Document Released: 04/07/2005 Document Revised: 10/07/2011 Document Reviewed: 08/09/2013 [facility] Patient Information 2015 [place], [place]. This information is not intended to replace advice given to you by your health care provider. Make sure you discuss any questions you have with your health care provider.

## 2015-03-15 ENCOUNTER — Encounter (HOSPITAL_COMMUNITY): Payer: Self-pay | Admitting: Emergency Medicine

## 2015-03-15 ENCOUNTER — Emergency Department (HOSPITAL_COMMUNITY)
Admission: EM | Admit: 2015-03-15 | Discharge: 2015-03-16 | Disposition: A | Discharge status: Home / Self Care | Payer: Self-pay | Attending: Emergency Medicine | Admitting: Emergency Medicine

## 2015-03-15 DIAGNOSIS — Z7952 Long term (current) use of systemic steroids: Secondary | ICD-10-CM | POA: Insufficient documentation

## 2015-03-15 DIAGNOSIS — E669 Obesity, unspecified: Secondary | ICD-10-CM | POA: Insufficient documentation

## 2015-03-15 DIAGNOSIS — Z792 Long term (current) use of antibiotics: Secondary | ICD-10-CM | POA: Insufficient documentation

## 2015-03-15 DIAGNOSIS — R011 Cardiac murmur, unspecified: Secondary | ICD-10-CM | POA: Insufficient documentation

## 2015-03-15 DIAGNOSIS — R42 Dizziness and giddiness: Secondary | ICD-10-CM | POA: Insufficient documentation

## 2015-03-15 DIAGNOSIS — K625 Hemorrhage of anus and rectum: Secondary | ICD-10-CM | POA: Insufficient documentation

## 2015-03-15 DIAGNOSIS — Z791 Long term (current) use of non-steroidal anti-inflammatories (NSAID): Secondary | ICD-10-CM | POA: Insufficient documentation

## 2015-03-15 DIAGNOSIS — K921 Melena: Secondary | ICD-10-CM

## 2015-03-15 DIAGNOSIS — M5441 Lumbago with sciatica, right side: Secondary | ICD-10-CM | POA: Insufficient documentation

## 2015-03-15 MED ORDER — DIAZEPAM 5 MG/ML IJ SOLN
5.0000 mg | Freq: Once | INTRAMUSCULAR | Status: AC
  Administered 2015-03-16: 5 mg via INTRAMUSCULAR
  Filled 2015-03-15: qty 2

## 2015-03-15 MED ORDER — MORPHINE SULFATE (PF) 4 MG/ML IV SOLN
4.0000 mg | Freq: Once | INTRAVENOUS | Status: AC
  Administered 2015-03-16: 4 mg via INTRAMUSCULAR
  Filled 2015-03-15: qty 1
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
## 2015-03-16 LAB — BASIC METABOLIC PANEL
Anion gap: 8 (ref 5–15)
BUN: 12 mg/dL (ref 6–20)
CO2: 25 mmol/L (ref 22–32)
Calcium: 9 mg/dL (ref 8.9–10.3)
Chloride: 106 mmol/L (ref 101–111)
Creatinine, Ser: 1.17 mg/dL (ref 0.61–1.24)
GFR calc Af Amer: >60 mL/min (ref >60)
GLUCOSE: 115 mg/dL — AB (ref 65–99)
POTASSIUM: 4.2 mmol/L (ref 3.5–5.1)
Sodium: 139 mmol/L (ref 135–145)

## 2015-03-16 LAB — I-STAT CHEM 8, ED
BUN: 13 mg/dL (ref 6–20)
Calcium, Ion: 1.13 mmol/L (ref 1.12–1.23)
Chloride: 103 mmol/L (ref 101–111)
Creatinine, Ser: 1.1 mg/dL (ref 0.61–1.24)
GLUCOSE: 115 mg/dL — AB (ref 65–99)
HEMATOCRIT: 49 % (ref 39.0–52.0)
HEMOGLOBIN: 16.7 g/dL (ref 13.0–17.0)
POTASSIUM: 4 mmol/L (ref 3.5–5.1)
Sodium: 142 mmol/L (ref 135–145)
TCO2: 25 mmol/L (ref 0–100)

## 2015-03-16 LAB — ABO/RH: ABO/RH(D): O POS

## 2015-03-16 LAB — TYPE AND SCREEN
ABO/RH(D): O POS
ANTIBODY SCREEN: NEGATIVE

## 2015-03-16 LAB — CBC
HEMATOCRIT: 45.5 % (ref 39.0–52.0)
Hemoglobin: 14.2 g/dL (ref 13.0–17.0)
MCH: 26.6 pg (ref 26.0–34.0)
MCHC: 31.2 g/dL (ref 30.0–36.0)
MCV: 85.2 fL (ref 78.0–100.0)
Platelets: 183 10*3/uL (ref 150–400)
RBC: 5.34 MIL/uL (ref 4.22–5.81)
RDW: 13.2 % (ref 11.5–15.5)
WBC: 9.5 10*3/uL (ref 4.0–10.5)

## 2015-03-16 LAB — APTT: aPTT: 29 seconds (ref 24–37)

## 2015-03-16 LAB — PROTIME-INR
INR: 1.02 (ref 0.00–1.49)
PROTHROMBIN TIME: 13.6 s (ref 11.6–15.2)

## 2015-03-16 LAB — POC OCCULT BLOOD, ED
Fecal Occult Bld: NEGATIVE
Fecal Occult Bld: NEGATIVE

## 2015-03-16 MED ORDER — CYCLOBENZAPRINE HCL 10 MG PO TABS: 10.0000 mg | ORAL_TABLET | Freq: Two times a day (BID) | ORAL | Status: DC | PRN

## 2015-03-16 MED ORDER — PREDNISONE 20 MG PO TABS
60.0000 mg | ORAL_TABLET | Freq: Once | ORAL | Status: AC
  Administered 2015-03-16: 60 mg via ORAL
  Filled 2015-03-16: qty 3

## 2015-03-16 MED ORDER — SODIUM CHLORIDE 0.9 % IV SOLN
1000.0000 mL | INTRAVENOUS | Status: DC
  Administered 2015-03-16: 1000 mL via INTRAVENOUS

## 2015-03-16 MED ORDER — SODIUM CHLORIDE 0.9 % IV SOLN
1000.0000 mL | Freq: Once | INTRAVENOUS | Status: AC
  Administered 2015-03-16: 1000 mL via INTRAVENOUS

## 2015-03-16 MED ORDER — OXYCODONE-ACETAMINOPHEN 5-325 MG PO TABS
2.0000 | ORAL_TABLET | Freq: Once | ORAL | Status: DC
  Filled 2015-03-16: qty 2

## 2015-03-16 MED ORDER — TRAMADOL HCL 50 MG PO TABS: 50.0000 mg | ORAL_TABLET | Freq: Four times a day (QID) | ORAL | Status: DC | PRN

## 2015-03-16 MED ORDER — PREDNISONE 20 MG PO TABS: 40.0000 mg | ORAL_TABLET | Freq: Every day | ORAL | Status: DC

## 2015-03-16 NOTE — ED Notes (Signed)
Asked PA for IV orders for meds as they are not compatible and patient would need several IM injections and a blood draw. PA said to give injections, no new orders.

## 2015-03-16 NOTE — ED Notes (Signed)
PA at bedside.

## 2015-03-16 NOTE — ED Notes (Signed)
Occult blood negative

## 2015-03-16 NOTE — Discharge Instructions (Signed)
Sciatica °Sciatica is pain, weakness, numbness, or tingling along the path of the sciatic nerve. The nerve starts in the lower back and runs down the back of each leg. The nerve controls the muscles in the lower leg and in the back of the knee, while also providing sensation to the back of the thigh, lower leg, and the sole of your foot. Sciatica is a symptom of another medical condition. For instance, nerve damage or certain conditions, such as a herniated disk or bone spur on the spine, pinch or put pressure on the sciatic nerve. This causes the pain, weakness, or other sensations normally associated with sciatica. Generally, sciatica only affects one side of the body. °CAUSES  °· Herniated or slipped disc. °· Degenerative disk disease. °· A pain disorder involving the narrow muscle in the buttocks (piriformis syndrome). °· Pelvic injury or fracture. °· Pregnancy. °· Tumor (rare). °SYMPTOMS  °Symptoms can vary from mild to very severe. The symptoms usually travel from the low back to the buttocks and down the back of the leg. Symptoms can include: °· Mild tingling or dull aches in the lower back, leg, or hip. °· Numbness in the back of the calf or sole of the foot. °· Burning sensations in the lower back, leg, or hip. °· Sharp pains in the lower back, leg, or hip. °· Leg weakness. °· Severe back pain inhibiting movement. °These symptoms may get worse with coughing, sneezing, laughing, or prolonged sitting or standing. Also, being overweight may worsen symptoms. °DIAGNOSIS  °Your caregiver will perform a physical exam to look for common symptoms of sciatica. He or she may ask you to do certain movements or activities that would trigger sciatic nerve pain. Other tests may be performed to find the cause of the sciatica. These may include: °· Blood tests. °· X-rays. °· Imaging tests, such as an MRI or CT scan. °TREATMENT  °Treatment is directed at the cause of the sciatic pain. Sometimes, treatment is not necessary  and the pain and discomfort goes away on its own. If treatment is needed, your caregiver may suggest: °· Over-the-counter medicines to relieve pain. °· Prescription medicines, such as anti-inflammatory medicine, muscle relaxants, or narcotics. °· Applying heat or ice to the painful area. °· Steroid injections to lessen pain, irritation, and inflammation around the nerve. °· Reducing activity during periods of pain. °· Exercising and stretching to strengthen your abdomen and improve flexibility of your spine. Your caregiver may suggest losing weight if the extra weight makes the back pain worse. °· Physical therapy. °· Surgery to eliminate what is pressing or pinching the nerve, such as a bone spur or part of a herniated disk. °HOME CARE INSTRUCTIONS  °· Only take over-the-counter or prescription medicines for pain or discomfort as directed by your caregiver. °· Apply ice to the affected area for 20 minutes, 3-4 times a day for the first 48-72 hours. Then try heat in the same way. °· Exercise, stretch, or perform your usual activities if these do not aggravate your pain. °· Attend physical therapy sessions as directed by your caregiver. °· Keep all follow-up appointments as directed by your caregiver. °· Do not wear high heels or shoes that do not provide proper support. °· Check your mattress to see if it is too soft. A firm mattress may lessen your pain and discomfort. °SEEK IMMEDIATE MEDICAL CARE IF:  °· You lose control of your bowel or bladder (incontinence). °· You have increasing weakness in the lower back, pelvis, buttocks,   or legs.  You have redness or swelling of your back.  You have a burning sensation when you urinate.  You have pain that gets worse when you lie down or awakens you at night.  Your pain is worse than you have experienced in the past.  Your pain is lasting longer than 4 weeks.  You are suddenly losing weight without reason. MAKE SURE YOU:  Understand these  instructions.  Will watch your condition.  Will get help right away if you are not doing well or get worse.   This information is not intended to replace advice given to you by your health care provider. Make sure you discuss any questions you have with your health care provider.   Document Released: 04/01/2001 Document Revised: 12/27/2014 Document Reviewed: 08/17/2011 Elsevier Interactive Patient Education 2016 [employer] Inc[employer]   Gastrointestinal Bleeding Gastrointestinal (GI) bleeding means there is bleeding somewhere along the digestive tract, between the mouth and anus. CAUSES  There are many different problems that can cause GI bleeding. Possible causes include:  Esophagitis. This is inflammation, irritation, or swelling of the esophagus.  Hemorrhoids.These are veins that are full of blood (engorged) in the rectum. They cause pain, inflammation, and may bleed.  Anal fissures.These are areas of painful tearing which may bleed. They are often caused by passing hard stool.  Diverticulosis.These are pouches that form on the colon over time, with age, and may bleed significantly.  Diverticulitis.This is inflammation in areas with diverticulosis. It can cause pain, fever, and bloody stools, although bleeding is rare.  Polyps and cancer. Colon cancer often starts out as precancerous polyps.  Gastritis and ulcers.Bleeding from the upper gastrointestinal tract (near the stomach) may travel through the intestines and produce black, sometimes tarry, often bad smelling stools. In certain cases, if the bleeding is fast enough, the stools may not be black, but red. This condition may be life-threatening. SYMPTOMS   Vomiting bright red blood or material that looks like coffee grounds.  Bloody, black, or tarry stools. DIAGNOSIS  Your caregiver may diagnose your condition by taking your history and performing a physical exam. More tests may be needed, including:  X-rays and other imaging  tests.  Esophagogastroduodenoscopy (EGD). This test uses a flexible, lighted tube to look at your esophagus, stomach, and small intestine.  Colonoscopy. This test uses a flexible, lighted tube to look at your colon. TREATMENT  Treatment depends on the cause of your bleeding.   For bleeding from the esophagus, stomach, small intestine, or colon, the caregiver doing your EGD or colonoscopy may be able to stop the bleeding as part of the procedure.  Inflammation or infection of the colon can be treated with medicines.  Many rectal problems can be treated with creams, suppositories, or warm baths.  Surgery is sometimes needed.  Blood transfusions are sometimes needed if you have lost a lot of blood. If bleeding is slow, you may be allowed to go home. If there is a lot of bleeding, you will need to stay in the hospital for observation. HOME CARE INSTRUCTIONS   Take any medicines exactly as prescribed.  Keep your stools soft by eating foods that are high in fiber. These foods include whole grains, legumes, fruits, and vegetables. Prunes (1 to 3 a day) work well for many people.  Drink enough fluids to keep your urine clear or pale yellow. SEEK IMMEDIATE MEDICAL CARE IF:   Your bleeding increases.  You feel lightheaded, weak, or you faint.  You have severe cramps  in your back or abdomen.  You pass large blood clots in your stool.  Your problems are getting worse. MAKE SURE YOU:   Understand these instructions.  Will watch your condition.  Will get help right away if you are not doing well or get worse.   This information is not intended to replace advice given to you by your health care provider. Make sure you discuss any questions you have with your health care provider.   Document Released: 04/04/2000 Document Revised: 03/24/2012 Document Reviewed: 09/25/2014 Elsevier Interactive Patient Education 2016 Elsevier Inc.  Chronic Back Pain  When back pain lasts longer than 3  months, it is called chronic back pain.People with chronic back pain often go through certain periods that are more intense (flare-ups).  CAUSES Chronic back pain can be caused by wear and tear (degeneration) on different structures in your back. These structures include:  The bones of your spine (vertebrae) and the joints surrounding your spinal cord and nerve roots (facets).  The strong, fibrous tissues that connect your vertebrae (ligaments). Degeneration of these structures may result in pressure on your nerves. This can lead to constant pain. HOME CARE INSTRUCTIONS  Avoid bending, heavy lifting, prolonged sitting, and activities which make the problem worse.  Take brief periods of rest throughout the day to reduce your pain. Lying down or standing usually is better than sitting while you are resting.  Take over-the-counter or prescription medicines only as directed by your caregiver. SEEK IMMEDIATE MEDICAL CARE IF:   You have weakness or numbness in one of your legs or feet.  You have trouble controlling your bladder or bowels.  You have nausea, vomiting, abdominal pain, shortness of breath, or fainting.   This information is not intended to replace advice given to you by your health care provider. Make sure you discuss any questions you have with your health care provider.   Document Released: 05/15/2004 Document Revised: 06/30/2011 Document Reviewed: 09/25/2014 Elsevier Interactive Patient Education [employer]2016 Elsevier Inc.

## 2015-03-16 NOTE — ED Notes (Signed)
Patient verbalized understanding of discharge instructions and prescription medications and denies any further needs nor questions at this time. VS stable. Patient ambulatory with steady gait.

## 2015-05-04 ENCOUNTER — Encounter (HOSPITAL_COMMUNITY): Payer: Self-pay | Admitting: *Deleted

## 2015-05-04 ENCOUNTER — Emergency Department (HOSPITAL_COMMUNITY): Payer: Self-pay

## 2015-05-04 ENCOUNTER — Emergency Department (HOSPITAL_COMMUNITY)
Admission: EM | Admit: 2015-05-04 | Discharge: 2015-05-04 | Disposition: A | Discharge status: Home / Self Care | Payer: Self-pay | Attending: Emergency Medicine | Admitting: Emergency Medicine

## 2015-05-04 DIAGNOSIS — Z791 Long term (current) use of non-steroidal anti-inflammatories (NSAID): Secondary | ICD-10-CM | POA: Insufficient documentation

## 2015-05-04 DIAGNOSIS — Z79899 Other long term (current) drug therapy: Secondary | ICD-10-CM | POA: Insufficient documentation

## 2015-05-04 DIAGNOSIS — R011 Cardiac murmur, unspecified: Secondary | ICD-10-CM | POA: Insufficient documentation

## 2015-05-04 DIAGNOSIS — Z7952 Long term (current) use of systemic steroids: Secondary | ICD-10-CM | POA: Insufficient documentation

## 2015-05-04 DIAGNOSIS — E669 Obesity, unspecified: Secondary | ICD-10-CM | POA: Insufficient documentation

## 2015-05-04 DIAGNOSIS — M25561 Pain in right knee: Secondary | ICD-10-CM | POA: Insufficient documentation

## 2015-05-04 DIAGNOSIS — Z87828 Personal history of other (healed) physical injury and trauma: Secondary | ICD-10-CM | POA: Insufficient documentation

## 2015-05-04 DIAGNOSIS — G8929 Other chronic pain: Secondary | ICD-10-CM | POA: Insufficient documentation

## 2015-05-04 DIAGNOSIS — Z792 Long term (current) use of antibiotics: Secondary | ICD-10-CM | POA: Insufficient documentation

## 2015-05-04 MED ORDER — NAPROXEN 500 MG PO TABS: 500.0000 mg | ORAL_TABLET | Freq: Two times a day (BID) | ORAL | Status: DC

## 2015-05-04 MED ORDER — KETOROLAC TROMETHAMINE 60 MG/2ML IM SOLN
30.0000 mg | Freq: Once | INTRAMUSCULAR | Status: AC
  Administered 2015-05-04: 30 mg via INTRAMUSCULAR
  Filled 2015-05-04: qty 2
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
## 2015-05-04 NOTE — Discharge Instructions (Signed)

## 2017-05-23 ENCOUNTER — Encounter (HOSPITAL_BASED_OUTPATIENT_CLINIC_OR_DEPARTMENT_OTHER): Payer: Self-pay | Admitting: *Deleted

## 2017-05-23 ENCOUNTER — Emergency Department (HOSPITAL_BASED_OUTPATIENT_CLINIC_OR_DEPARTMENT_OTHER)
Admission: EM | Admit: 2017-05-23 | Discharge: 2017-05-23 | Disposition: A | Discharge status: Home / Self Care | Payer: Self-pay | Attending: Emergency Medicine | Admitting: Emergency Medicine

## 2017-05-23 ENCOUNTER — Emergency Department (HOSPITAL_BASED_OUTPATIENT_CLINIC_OR_DEPARTMENT_OTHER): Payer: Self-pay

## 2017-05-23 DIAGNOSIS — M25561 Pain in right knee: Secondary | ICD-10-CM | POA: Insufficient documentation

## 2017-05-23 MED ORDER — KETOROLAC TROMETHAMINE 60 MG/2ML IM SOLN
60.0000 mg | Freq: Once | INTRAMUSCULAR | Status: AC
  Administered 2017-05-23: 60 mg via INTRAMUSCULAR
  Filled 2017-05-23: qty 2

## 2017-05-23 MED ORDER — NAPROXEN 500 MG PO TABS: 500.0000 mg | ORAL_TABLET | Freq: Two times a day (BID) | ORAL | 0 refills | Status: DC
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
## 2017-11-09 ENCOUNTER — Emergency Department (HOSPITAL_COMMUNITY): Payer: Self-pay

## 2017-11-09 ENCOUNTER — Encounter (HOSPITAL_COMMUNITY): Payer: Self-pay | Admitting: Emergency Medicine

## 2017-11-09 ENCOUNTER — Other Ambulatory Visit: Payer: Self-pay

## 2017-11-09 ENCOUNTER — Emergency Department (HOSPITAL_COMMUNITY)
Admission: EM | Admit: 2017-11-09 | Discharge: 2017-11-10 | Disposition: A | Discharge status: Home / Self Care | Payer: Self-pay | Attending: Emergency Medicine | Admitting: Emergency Medicine

## 2017-11-09 DIAGNOSIS — R51 Headache: Secondary | ICD-10-CM | POA: Insufficient documentation

## 2017-11-09 DIAGNOSIS — X30XXXA Exposure to excessive natural heat, initial encounter: Secondary | ICD-10-CM | POA: Insufficient documentation

## 2017-11-09 DIAGNOSIS — T675XXA Heat exhaustion, unspecified, initial encounter: Secondary | ICD-10-CM

## 2017-11-09 DIAGNOSIS — R55 Syncope and collapse: Secondary | ICD-10-CM

## 2017-11-09 DIAGNOSIS — R519 Headache, unspecified: Secondary | ICD-10-CM

## 2017-11-09 LAB — COMPREHENSIVE METABOLIC PANEL
ALBUMIN: 3.4 g/dL — AB (ref 3.5–5.0)
ALT: 19 U/L (ref 0–44)
AST: 23 U/L (ref 15–41)
Alkaline Phosphatase: 70 U/L (ref 38–126)
Anion gap: 7 (ref 5–15)
BUN: 13 mg/dL (ref 6–20)
CALCIUM: 8.7 mg/dL — AB (ref 8.9–10.3)
CO2: 29 mmol/L (ref 22–32)
CREATININE: 1.04 mg/dL (ref 0.61–1.24)
Chloride: 105 mmol/L (ref 98–111)
GFR calc Af Amer: >60 mL/min (ref >60)
GFR calc non Af Amer: >60 mL/min (ref >60)
Glucose, Bld: 131 mg/dL — ABNORMAL HIGH (ref 70–99)
Potassium: 4.2 mmol/L (ref 3.5–5.1)
Sodium: 141 mmol/L (ref 135–145)
Total Bilirubin: 1.2 mg/dL (ref 0.3–1.2)
Total Protein: 7.5 g/dL (ref 6.5–8.1)

## 2017-11-09 LAB — I-STAT CHEM 8, ED
BUN: 12 mg/dL (ref 6–20)
CALCIUM ION: 1.11 mmol/L — AB (ref 1.15–1.40)
CHLORIDE: 105 mmol/L (ref 98–111)
Creatinine, Ser: 1 mg/dL (ref 0.61–1.24)
GLUCOSE: 111 mg/dL — AB (ref 70–99)
HCT: 44 % (ref 39.0–52.0)
Hemoglobin: 15 g/dL (ref 13.0–17.0)
Potassium: 4.1 mmol/L (ref 3.5–5.1)
Sodium: 140 mmol/L (ref 135–145)
TCO2: 27 mmol/L (ref 22–32)

## 2017-11-09 LAB — CBC
HCT: 43.9 % (ref 39.0–52.0)
Hemoglobin: 13.9 g/dL (ref 13.0–17.0)
MCH: 27.1 pg (ref 26.0–34.0)
MCHC: 31.7 g/dL (ref 30.0–36.0)
MCV: 85.6 fL (ref 78.0–100.0)
Platelets: 154 10*3/uL (ref 150–400)
RBC: 5.13 MIL/uL (ref 4.22–5.81)
RDW: 13.2 % (ref 11.5–15.5)
WBC: 9.6 10*3/uL (ref 4.0–10.5)

## 2017-11-09 LAB — URINALYSIS, ROUTINE W REFLEX MICROSCOPIC
BILIRUBIN URINE: NEGATIVE
Glucose, UA: NEGATIVE mg/dL
HGB URINE DIPSTICK: NEGATIVE
KETONES UR: NEGATIVE mg/dL
Leukocytes, UA: NEGATIVE
Nitrite: NEGATIVE
Protein, ur: NEGATIVE mg/dL
SPECIFIC GRAVITY, URINE: 1.019 (ref 1.005–1.030)
pH: 7 (ref 5.0–8.0)

## 2017-11-09 LAB — I-STAT TROPONIN, ED: TROPONIN I, POC: 0 ng/mL (ref 0.00–0.08)

## 2017-11-09 LAB — CK: Total CK: 410 U/L — ABNORMAL HIGH (ref 49–397)

## 2017-11-09 LAB — LIPASE, BLOOD: Lipase: 27 U/L (ref 11–51)

## 2017-11-09 LAB — CBG MONITORING, ED: GLUCOSE-CAPILLARY: 104 mg/dL — AB (ref 70–99)

## 2017-11-09 MED ORDER — KETOROLAC TROMETHAMINE 30 MG/ML IJ SOLN
15.0000 mg | Freq: Once | INTRAMUSCULAR | Status: AC
  Administered 2017-11-09: 15 mg via INTRAVENOUS
  Filled 2017-11-09: qty 1

## 2017-11-09 MED ORDER — FENTANYL CITRATE (PF) 100 MCG/2ML IJ SOLN
50.0000 ug | Freq: Once | INTRAMUSCULAR | Status: AC
  Administered 2017-11-09: 50 ug via INTRAVENOUS
  Filled 2017-11-09: qty 2

## 2017-11-09 MED ORDER — ONDANSETRON HCL 4 MG/2ML IJ SOLN
4.0000 mg | Freq: Once | INTRAMUSCULAR | Status: AC
Start: 2017-11-09 — End: 2017-11-09
  Administered 2017-11-09: 4 mg via INTRAVENOUS
  Filled 2017-11-09: qty 2

## 2017-11-09 MED ORDER — SODIUM CHLORIDE 0.9 % IV SOLN
INTRAVENOUS | Status: DC
  Administered 2017-11-09: 22:00:00 via INTRAVENOUS

## 2017-11-09 MED ORDER — DIPHENHYDRAMINE HCL 50 MG/ML IJ SOLN
12.5000 mg | Freq: Once | INTRAMUSCULAR | Status: AC
  Administered 2017-11-09: 12.5 mg via INTRAVENOUS
  Filled 2017-11-09: qty 1

## 2017-11-09 MED ORDER — SODIUM CHLORIDE 0.9 % IV BOLUS
1000.0000 mL | Freq: Once | INTRAVENOUS | Status: AC
  Administered 2017-11-09: 1000 mL via INTRAVENOUS

## 2017-11-09 MED ORDER — PROCHLORPERAZINE EDISYLATE 10 MG/2ML IJ SOLN
5.0000 mg | Freq: Once | INTRAMUSCULAR | Status: AC
  Administered 2017-11-09: 5 mg via INTRAVENOUS
  Filled 2017-11-09: qty 2

## 2017-11-09 NOTE — ED Provider Notes (Signed)
[facility]-EMERGENCY DEPT Provider Note   CSN: [id number] Arrival date & time: 11/09/17  1808     History   Chief Complaint Chief Complaint  Patient presents with  . Emesis  . Dizziness  . Headache    HPI Ian Sparks is a 47 y.o. male in the emergency department chief complaint of headache and presyncope.  She states that on Friday while he was at work in the heat he suddenly felt presyncopal with tunnel vision, wearing in his ears and dizziness.  He got down on one knee on the ground and during that time he developed a throbbing headache.  Patient states that he went home and rested the entire weekend.  He has had no return of presyncopal feeling however continued to have generalized malaise and continue to have headache.  Patient states that he went to work again today where he was still extremely hot and had to leave again.  He had a couple episodes of vomiting today.  He continues to have his headache which is on the left side, throbbing, hot behind his left eye.  He has no pain with eye movement, he has no visual disturbances, rashes.  Afebrile.  No neck stiffness.  HPI  Past Medical History:  Diagnosis Date  . Heart murmur   . Obesity     There are no active problems to display for this patient.   History reviewed. No pertinent surgical history.      Home Medications    Prior to Admission medications   Medication Sig Start Date End Date Taking? Authorizing Provider  naproxen (NAPROSYN) 500 MG tablet Take 1 tablet (500 mg total) by mouth 2 (two) times daily. Patient not taking: Reported on 11/09/2017 05/23/17   Rolan Bucco, MD    Family History No family history on file.  Social History Social History   Tobacco Use  . Smoking status: Never Smoker  . Smokeless tobacco: Never Used  Substance Use Topics  . Alcohol use: Yes    Comment: occasional   . Drug use: No     Allergies   Aspirin   Review of Systems Review of  Systems Ten systems reviewed and are negative for acute change, except as noted in the HPI.    Physical Exam Updated Vital Signs BP 134/68   Pulse 63   Temp 98.7 F (37.1 C) (Oral)   Resp 13   SpO2 94%   Physical Exam  Constitutional: He is oriented to person, place, and time. He appears well-developed and well-nourished. No distress.  HENT:  Head: Normocephalic and atraumatic.  Mouth/Throat: Oropharynx is clear and moist.  Eyes: Pupils are equal, round, and reactive to light. Conjunctivae and EOM are normal. No scleral icterus.  No horizontal, vertical or rotational nystagmus  Neck: Normal range of motion. Neck supple.  Full active and passive ROM without pain No midline or paraspinal tenderness No nuchal rigidity or meningeal signs  Cardiovascular: Normal rate, regular rhythm and intact distal pulses.  Pulmonary/Chest: Effort normal and breath sounds normal. No respiratory distress. He has no wheezes. He has no rales.  Abdominal: Soft. Bowel sounds are normal. There is no tenderness. There is no rebound and no guarding.  Musculoskeletal: Normal range of motion.  Lymphadenopathy:    He has no cervical adenopathy.  Neurological: He is alert and oriented to person, place, and time. He has normal reflexes. No cranial nerve deficit. He exhibits normal muscle tone. Coordination normal.  Mental Status:  Alert,  oriented, thought content appropriate. Speech fluent without evidence of aphasia. Able to follow 2 step commands without difficulty.  Cranial Nerves:  II:  Peripheral visual fields grossly normal, pupils equal, round, reactive to light III,IV, VI: ptosis not present, extra-ocular motions intact bilaterally  V,VII: smile symmetric, facial light touch sensation equal VIII: hearing grossly normal bilaterally  IX,X: midline uvula rise  XI: bilateral shoulder shrug equal and strong XII: midline tongue extension  Motor:  5/5 in upper and lower extremities bilaterally including  strong and equal grip strength and dorsiflexion/plantar flexion Sensory: Pinprick and light touch normal in all extremities.  Deep Tendon Reflexes: 2+ and symmetric  Cerebellar: normal finger-to-nose with bilateral upper extremities Gait: normal gait and balance CV: distal pulses palpable throughout   Skin: Skin is warm and dry. No rash noted. He is not diaphoretic.  Psychiatric: He has a normal mood and affect. His behavior is normal. Judgment and thought content normal.  Nursing note and vitals reviewed.    ED Treatments / Results  Labs (all labs ordered are listed, but only abnormal results are displayed) Labs Reviewed  COMPREHENSIVE METABOLIC PANEL - Abnormal; Notable for the following components:      Result Value   Glucose, Bld 131 (*)    Calcium 8.7 (*)    Albumin 3.4 (*)    All other components within normal limits  CK - Abnormal; Notable for the following components:   Total CK 410 (*)    All other components within normal limits  CBG MONITORING, ED - Abnormal; Notable for the following components:   Glucose-Capillary 104 (*)    All other components within normal limits  I-STAT CHEM 8, ED - Abnormal; Notable for the following components:   Glucose, Bld 111 (*)    Calcium, Ion 1.11 (*)    All other components within normal limits  LIPASE, BLOOD  CBC  URINALYSIS, ROUTINE W REFLEX MICROSCOPIC  I-STAT TROPONIN, ED    EKG EKG Interpretation  Date/Time:  Monday November 09 2017 22:17:53 EDT Ventricular Rate:  67 PR Interval:    QRS Duration: 114 QT Interval:  402 QTC Calculation: 425 R Axis:   -63 Text Interpretation:  Sinus rhythm Incomplete left bundle branch block Nonspecific T wave abnormality When compared with ECG of 11/07/2017, No significant change was found Confirmed by Dione Booze ([id number]) on 11/09/2017 10:21:05 PM   Radiology Ct Head Wo Contrast  Result Date: 11/09/2017 CLINICAL DATA:  Headache, dizziness. EXAM: CT HEAD WITHOUT CONTRAST TECHNIQUE:  Contiguous axial images were obtained from the base of the skull through the vertex without intravenous contrast. COMPARISON:  None. FINDINGS: Brain: No evidence of acute infarction, hemorrhage, hydrocephalus, extra-axial collection or mass lesion/mass effect. Vascular: No hyperdense vessel or unexpected calcification. Skull: Normal. Negative for fracture or focal lesion. Sinuses/Orbits: No acute finding. Other: None. IMPRESSION: Normal head CT. Electronically Signed   By: Lupita Raider, M.D.   On: 11/09/2017 22:02    Procedures Procedures (including critical care time)  Medications Ordered in ED Medications  sodium chloride 0.9 % bolus 1,000 mL (1,000 mLs Intravenous New Bag/Given 11/09/17 2209)    And  0.9 %  sodium chloride infusion ( Intravenous New Bag/Given 11/09/17 2209)  fentaNYL (SUBLIMAZE) injection 50 mcg (50 mcg Intravenous Given 11/09/17 2210)  ondansetron (ZOFRAN) injection 4 mg (4 mg Intravenous Given 11/09/17 2209)  prochlorperazine (COMPAZINE) injection 5 mg (5 mg Intravenous Given 11/09/17 2319)  diphenhydrAMINE (BENADRYL) injection 12.5 mg (12.5 mg Intravenous Given 11/09/17 2318)  ketorolac (TORADOL) 30 MG/ML injection 15 mg (15 mg Intravenous Given 11/09/17 2318)     Initial Impression / Assessment and Plan / ED Course  I have reviewed the triage vital signs and the nursing notes.  Pertinent labs & imaging results that were available during my care of the patient were reviewed by me and considered in my medical decision making (see chart for details).     Patient headache resolved with medications.  He has eaten in the extremely large meal from the [employer] while here.  Suspect hydration and heat injury as the cause of his symptoms given his slight elevation in his CK level.  Patient rehydrated here in the emergency department.  Patient CT scan negative.  Take resolved with medications.  EKG abnormal but without signs of acute ischemia or arrhythmia and unchanged  from previous.  Labs are otherwise reassuring.  I discussed return precautions with the patient who is appropriate for discharge at this time  Final Clinical Impressions(s) / ED Diagnoses   Final diagnoses:  Bad headache  Heat exhaustion, initial encounter  Pre-syncope    ED Discharge Orders    None       Arthor CaptainHarris, Shaye Lagace, PA-C 11/10/17 0016    Bethann BerkshireZammit, Joseph, MD 11/11/17 [phone]

## 2017-11-09 NOTE — ED Notes (Signed)
Patient educated about not driving or performing other critical tasks (such as operating heavy machinery, caring for infant/toddler/child) due to sedative nature of narcotic medications received while in the ED.  Pt/caregiver verbalized understanding.   

## 2017-11-09 NOTE — Discharge Instructions (Signed)
°  You are having a headache. No specific cause was found today for your headache. It may have been a migraine or other cause of headache. Stress, anxiety, fatigue, and depression are common triggers for headaches. Your headache today does not appear to be life-threatening or require hospitalization, but often the exact cause of headaches is not determined in the emergency department. Therefore, follow-up with your doctor is very important to find out what may have caused your headache, and whether or not you need any further diagnostic testing or treatment. Sometimes headaches can appear benign (not harmful), but then more serious symptoms can develop which should prompt an immediate re-evaluation by your doctor or the emergency department. SEEK MEDICAL ATTENTION IF: You develop possible problems with medications prescribed.  The medications don't resolve your headache, if it recurs , or if you have multiple episodes of vomiting or can't take fluids. You have a change from the usual headache. RETURN IMMEDIATELY IF you develop a sudden, severe headache or confusion, become poorly responsive or faint, develop a fever above 100.5F or problem breathing, have a change in speech, vision, swallowing, or understanding, or develop new weakness, numbness, tingling, incoordination, or have a seizure.  You have any symptoms of heat stroke. These include: Fever. Vomiting. Red skin. Inability to sweat, resulting in hot, dry skin. Excessive thirst. Rapid breathing. Headache. Confusion or disorientation. Fainting. Seizures.

## 2017-11-09 NOTE — ED Triage Notes (Signed)
Pt c/o getting hot at work today with headache, dizziness and vomiting, diarrhea.

## 2018-02-15 ENCOUNTER — Emergency Department (HOSPITAL_COMMUNITY)
Admission: EM | Admit: 2018-02-15 | Discharge: 2018-02-15 | Disposition: A | Discharge status: Home / Self Care | Payer: Self-pay | Attending: Emergency Medicine | Admitting: Emergency Medicine

## 2018-02-15 ENCOUNTER — Encounter (HOSPITAL_COMMUNITY): Payer: Self-pay | Admitting: *Deleted

## 2018-02-15 DIAGNOSIS — R0981 Nasal congestion: Secondary | ICD-10-CM | POA: Insufficient documentation

## 2018-02-15 DIAGNOSIS — J029 Acute pharyngitis, unspecified: Secondary | ICD-10-CM | POA: Insufficient documentation

## 2018-02-15 DIAGNOSIS — R05 Cough: Secondary | ICD-10-CM | POA: Insufficient documentation

## 2018-02-15 LAB — GROUP A STREP BY PCR: GROUP A STREP BY PCR: NOT DETECTED

## 2018-02-15 MED ORDER — DEXAMETHASONE SODIUM PHOSPHATE 10 MG/ML IJ SOLN
10.0000 mg | Freq: Once | INTRAMUSCULAR | Status: AC
  Administered 2018-02-15: 10 mg via INTRAMUSCULAR
  Filled 2018-02-15: qty 1

## 2018-02-15 MED ORDER — PHENOL 1.4 % MT LIQD: 1.0000 | OROMUCOSAL | 0 refills | Status: DC | PRN

## 2018-02-15 MED ORDER — BENZONATATE 100 MG PO CAPS: 100.0000 mg | ORAL_CAPSULE | Freq: Three times a day (TID) | ORAL | 0 refills | Status: DC | PRN

## 2018-02-15 MED ORDER — FLUTICASONE PROPIONATE 50 MCG/ACT NA SUSP: 2.0000 | Freq: Every day | NASAL | 0 refills | Status: DC

## 2018-02-15 NOTE — Discharge Instructions (Signed)
Alternate 600 mg of ibuprofen and [phone] mg of Tylenol every 3 hours as needed for pain. Do not exceed 4000 mg of Tylenol daily.  Take ibuprofen with food to avoid upset stomach issues.  Drink plenty of water and get plenty of rest.  Use Chloraseptic spray, warm teas, honey, cough lozenges for sore throat. Tessalon for cough. Flonase for nasal congestion.  Return to the emergency department if any concerning signs or symptoms develop such as difficulty breathing or swallowing, high fevers, persistent vomiting or throat tightness.

## 2018-02-15 NOTE — ED Triage Notes (Signed)
Pt in c/o sore throat for the last week, no relief with home medications, no distress noted

## 2018-02-15 NOTE — ED Provider Notes (Signed)
[facility] EMERGENCY DEPARTMENT Provider Note   CSN: [id number] Arrival date & time: 02/15/18  1016     History   Chief Complaint Chief Complaint  Patient presents with  . Sore Throat    HPI Ian Sparks is a 47 y.o. male presents today for evaluation of acute onset, persistent sore throat for 1.5 weeks.  States his significant other has similar symptoms.  He notes nasal congestion which is somewhat chronic for him, nonproductive cough.  Denies shortness of breath or chest pain.  States he has tried numerous over-the-counter medications, warm tea, and soup without significant relief of his symptoms.  The history is provided by the patient.    Past Medical History:  Diagnosis Date  . Heart murmur   . Obesity     There are no active problems to display for this patient.   History reviewed. No pertinent surgical history.      Home Medications    Prior to Admission medications   Medication Sig Start Date End Date Taking? Authorizing Provider  benzonatate (TESSALON) 100 MG capsule Take 1 capsule (100 mg total) by mouth 3 (three) times daily as needed for cough. 02/15/18   Beauregard Jarrells A, PA-C  fluticasone (FLONASE) 50 MCG/ACT nasal spray Place 2 sprays into both nostrils daily. 02/15/18   Keera Altidor A, PA-C  naproxen (NAPROSYN) 500 MG tablet Take 1 tablet (500 mg total) by mouth 2 (two) times daily. Patient not taking: Reported on 11/09/2017 05/23/17   Rolan Bucco, MD  phenol (CHLORASEPTIC) 1.4 % LIQD Use as directed 1 spray in the mouth or throat as needed for throat irritation / pain. 02/15/18   Jeanie Sewer, PA-C    Family History History reviewed. No pertinent family history.  Social History Social History   Tobacco Use  . Smoking status: Never Smoker  . Smokeless tobacco: Never Used  Substance Use Topics  . Alcohol use: Yes    Comment: occasional   . Drug use: No     Allergies   Aspirin   Review of Systems Review of  Systems  Constitutional: Negative for fever.  HENT: Positive for congestion and sore throat.   Respiratory: Positive for cough. Negative for shortness of breath.   Cardiovascular: Negative for chest pain.     Physical Exam Updated Vital Signs BP 114/72 (BP Location: Right Arm)   Pulse 61   Temp 98.6 F (37 C) (Oral)   Resp 18   SpO2 98%   Physical Exam  Constitutional: He appears well-developed and well-nourished. No distress.  HENT:  Head: Normocephalic and atraumatic.  Right Ear: Tympanic membrane and ear canal normal. No swelling or tenderness.  Left Ear: Tympanic membrane normal. No swelling or tenderness.  Nose: Mucosal edema present. No septal deviation. Right sinus exhibits no maxillary sinus tenderness and no frontal sinus tenderness. Left sinus exhibits no maxillary sinus tenderness and no frontal sinus tenderness.  Mouth/Throat: Uvula is midline. Posterior oropharyngeal erythema present. No tonsillar abscesses. Tonsils are 2+ on the right. Tonsils are 2+ on the left. No tonsillar exudate.  Tolerating secretions without difficulty.  Eyes: Conjunctivae are normal. Right eye exhibits no discharge. Left eye exhibits no discharge.  Neck: Normal range of motion. Neck supple. No JVD present. No tracheal deviation present.  Cardiovascular: Normal rate, regular rhythm and normal heart sounds.  Pulmonary/Chest: Effort normal and breath sounds normal. No respiratory distress. He has no wheezes. He exhibits no tenderness.  Abdominal: Soft. Bowel sounds are normal.  He exhibits no distension. There is no tenderness.  Musculoskeletal: He exhibits no edema.  Lymphadenopathy:    He has no cervical adenopathy.  Neurological: He is alert.  Skin: Skin is warm and dry. No erythema.  Psychiatric: He has a normal mood and affect. His behavior is normal.  Nursing note and vitals reviewed.    ED Treatments / Results  Labs (all labs ordered are listed, but only abnormal results are  displayed) Labs Reviewed  GROUP A STREP BY PCR    EKG None  Radiology No results found.  Procedures Procedures (including critical care time)  Medications Ordered in ED Medications - No data to display   Initial Impression / Assessment and Plan / ED Course  I have reviewed the triage vital signs and the nursing notes.  Pertinent labs & imaging results that were available during my care of the patient were reviewed by me and considered in my medical decision making (see chart for details).      Pt afebrile without tonsillar exudate, negative strep. Presents with cough, sore throat, nasal congestion, and dysphagia; diagnosis of viral pharyngitis/viral URI.  Discussed antibiotics are not indicated.  Will discharge with symptom medic treatment for pain.  Pt does not appear dehydrated, but did discuss importance of water rehydration. Presentation non concerning for PTA or infxn spread to soft tissue. No trismus or uvula deviation.  No meningeal signs.  Tolerating secretions without difficulty.  Lungs clear to auscultation bilaterally, doubt pneumonia in the absence of fever or shortness of breath.  Discussed strict ED return precautions.   Pt verbalized understanding of and agreement with plan and is safe for discharge home at this time.  Final Clinical Impressions(s) / ED Diagnoses   Final diagnoses:  Sore throat    ED Discharge Orders         Ordered    benzonatate (TESSALON) 100 MG capsule  3 times daily PRN     02/15/18 1207    phenol (CHLORASEPTIC) 1.4 % LIQD  As needed     02/15/18 1207    fluticasone (FLONASE) 50 MCG/ACT nasal spray  Daily     02/15/18 1207           Jeanie Sewer, PA-C 02/15/18 1210    Pricilla Loveless, MD 02/15/18 1520

## 2018-04-14 ENCOUNTER — Other Ambulatory Visit: Payer: Self-pay

## 2018-04-14 ENCOUNTER — Emergency Department (HOSPITAL_COMMUNITY)
Admission: EM | Admit: 2018-04-14 | Discharge: 2018-04-14 | Disposition: A | Discharge status: Home / Self Care | Payer: Self-pay | Attending: Emergency Medicine | Admitting: Emergency Medicine

## 2018-04-14 ENCOUNTER — Encounter (HOSPITAL_COMMUNITY): Payer: Self-pay

## 2018-04-14 ENCOUNTER — Emergency Department (HOSPITAL_COMMUNITY): Payer: Self-pay

## 2018-04-14 DIAGNOSIS — J020 Streptococcal pharyngitis: Secondary | ICD-10-CM | POA: Insufficient documentation

## 2018-04-14 LAB — GROUP A STREP BY PCR: GROUP A STREP BY PCR: DETECTED — AB

## 2018-04-14 MED ORDER — PENICILLIN G BENZATHINE 1200000 UNIT/2ML IM SUSP
1.2000 10*6.[IU] | Freq: Once | INTRAMUSCULAR | Status: AC
  Administered 2018-04-14: 1.2 10*6.[IU] via INTRAMUSCULAR
  Filled 2018-04-14: qty 2

## 2018-04-14 MED ORDER — LIDOCAINE VISCOUS HCL 2 % MT SOLN
15.0000 mL | Freq: Once | OROMUCOSAL | Status: AC
  Administered 2018-04-14: 15 mL via OROMUCOSAL
  Filled 2018-04-14: qty 15

## 2018-04-14 MED ORDER — LIDOCAINE VISCOUS HCL 2 % MT SOLN: 15.0000 mL | OROMUCOSAL | 0 refills | Status: DC | PRN

## 2018-04-14 MED ORDER — IBUPROFEN 200 MG PO TABS
600.0000 mg | ORAL_TABLET | Freq: Once | ORAL | Status: AC
  Administered 2018-04-14: 600 mg via ORAL
  Filled 2018-04-14: qty 1

## 2018-04-14 NOTE — Discharge Instructions (Signed)
Please rest and drink cool fluids Try lidocaine for throat pain Take Tylenol and Ibuprofen for fever Return if worsening

## 2018-04-14 NOTE — ED Provider Notes (Signed)
[facility] Provider Note   CSN: [id number] Arrival date & time: 04/14/18  [id number]     History   Chief Complaint Chief Complaint  Patient presents with  . Cough  . Fever  . Sore Throat    HPI Ian Sparks is a 47 y.o. male who presents with URI symptoms.  Past medical history significant for obesity.  The patient states that he has had symptoms for the past 3 days.  He reports intermittent fevers, ear pain, congestion, runny nose, loss of smell, sore throat, cough, generalized body aches.  He has had sick contacts people with bronchitis.  No chest pain, shortness of breath or wheezing.  He has been taking multiple different over-the-counter medicines and nothing has been helping him.  He states his sore throat is really bothering him.  HPI  Past Medical History:  Diagnosis Date  . Heart murmur   . Obesity     There are no active problems to display for this patient.   History reviewed. No pertinent surgical history.      Home Medications    Prior to Admission medications   Medication Sig Start Date End Date Taking? Authorizing Provider  benzonatate (TESSALON) 100 MG capsule Take 1 capsule (100 mg total) by mouth 3 (three) times daily as needed for cough. 02/15/18   Fawze, Mina A, PA-C  fluticasone (FLONASE) 50 MCG/ACT nasal spray Place 2 sprays into both nostrils daily. 02/15/18   Fawze, Mina A, PA-C  naproxen (NAPROSYN) 500 MG tablet Take 1 tablet (500 mg total) by mouth 2 (two) times daily. Patient not taking: Reported on 11/09/2017 05/23/17   Rolan BuccoBelfi, Melanie, MD  phenol (CHLORASEPTIC) 1.4 % LIQD Use as directed 1 spray in the mouth or throat as needed for throat irritation / pain. 02/15/18   Jeanie SewerFawze, Mina A, PA-C    Family History History reviewed. No pertinent family history.  Social History Social History   Tobacco Use  . Smoking status: Never Smoker  . Smokeless tobacco: Never Used  Substance Use Topics  . Alcohol  use: Yes    Comment: occasional   . Drug use: No     Allergies   Aspirin   Review of Systems Review of Systems  Constitutional: Positive for fatigue and fever.  HENT: Positive for congestion, ear pain, rhinorrhea, sneezing and sore throat. Negative for trouble swallowing.   Respiratory: Positive for cough. Negative for shortness of breath and wheezing.   Cardiovascular: Negative for chest pain.     Physical Exam Updated Vital Signs BP (!) 157/79 (BP Location: Right Arm)   Pulse 65   Temp 98.9 F (37.2 C) (Core (Comment))   Ht 6\' 2"  (1.88 m)   Wt (!) 157.9 kg   SpO2 97%   BMI 44.68 kg/m   Physical Exam Vitals signs and nursing note reviewed.  Constitutional:      General: He is not in acute distress.    Appearance: He is well-developed. He is obese.     Comments: Cooperative. Fatigued appearing  HENT:     Head: Normocephalic and atraumatic.     Right Ear: Hearing, tympanic membrane, ear canal and external ear normal.     Left Ear: Hearing, tympanic membrane, ear canal and external ear normal.     Nose: Mucosal edema present.     Mouth/Throat:     Mouth: Mucous membranes are moist.     Pharynx: Pharyngeal swelling and posterior oropharyngeal erythema present.  Eyes:  General: No scleral icterus.       Right eye: No discharge.        Left eye: No discharge.     Conjunctiva/sclera: Conjunctivae normal.     Pupils: Pupils are equal, round, and reactive to light.  Neck:     Musculoskeletal: Normal range of motion.  Cardiovascular:     Rate and Rhythm: Normal rate and regular rhythm.  Pulmonary:     Effort: Pulmonary effort is normal. No respiratory distress.     Breath sounds: Decreased breath sounds present.  Abdominal:     General: There is no distension.  Skin:    General: Skin is warm and dry.  Neurological:     Mental Status: He is alert and oriented to person, place, and time.  Psychiatric:        Behavior: Behavior normal.      ED Treatments /  Results  Labs (all labs ordered are listed, but only abnormal results are displayed) Labs Reviewed  GROUP A STREP BY PCR - Abnormal; Notable for the following components:      Result Value   Group A Strep by PCR DETECTED (*)    All other components within normal limits    EKG None  Radiology No results found.  Procedures Procedures (including critical care time)  Medications Ordered in ED Medications  lidocaine (XYLOCAINE) 2 % viscous mouth solution 15 mL (has no administration in time range)  ibuprofen (ADVIL,MOTRIN) tablet 600 mg (has no administration in time range)     Initial Impression / Assessment and Plan / ED Course  I have reviewed the triage vital signs and the nursing notes.  Pertinent labs & imaging results that were available during my care of the patient were reviewed by me and considered in my medical decision making (see chart for details).  47 year old male presents with URI symptoms, sore throat, cough for three days. He is hypertensive but otherwise vitals are normal. Will obtain CXR and strep, viscous lidocaine and Ibuprofen.  Strep is positive. CXR is normal. Will give IM PCN. Will give rx for lidocaine and work note. Return precautions given.  Final Clinical Impressions(s) / ED Diagnoses   Final diagnoses:  Strep pharyngitis    ED Discharge Orders    None       Bethel BornGekas, Taleeyah Bora Marie, PA-C 04/14/18 2243    Sabas SousBero, Michael M, MD 04/14/18 [id number]

## 2018-04-14 NOTE — ED Notes (Signed)
Patient verbalizes understanding of discharge instructions. Opportunity for questioning and answers were provided. Armband removed by staff, pt discharged from ED home via POV with family. 

## 2018-04-14 NOTE — ED Notes (Signed)
Pt reports flu-like symptoms for the past 3 days. Pt states taking multiple OTC meds without relief. Pt never did take a temp, just feels hot.

## 2018-04-14 NOTE — ED Triage Notes (Signed)
Per pt he has been having flu like symptoms for 3 days. Sore throat, fever, congestion, body aches,.

## 2018-11-11 ENCOUNTER — Encounter (HOSPITAL_COMMUNITY): Payer: Self-pay | Admitting: Emergency Medicine

## 2018-11-11 ENCOUNTER — Emergency Department (HOSPITAL_COMMUNITY)
Admission: EM | Admit: 2018-11-11 | Discharge: 2018-11-11 | Disposition: A | Discharge status: Home / Self Care | Payer: Self-pay | Attending: Emergency Medicine | Admitting: Emergency Medicine

## 2018-11-11 ENCOUNTER — Emergency Department (HOSPITAL_COMMUNITY): Payer: Self-pay

## 2018-11-11 ENCOUNTER — Other Ambulatory Visit: Payer: Self-pay

## 2018-11-11 DIAGNOSIS — M25562 Pain in left knee: Secondary | ICD-10-CM | POA: Insufficient documentation

## 2018-11-11 DIAGNOSIS — Z79899 Other long term (current) drug therapy: Secondary | ICD-10-CM | POA: Insufficient documentation

## 2018-11-11 MED ORDER — NAPROXEN 500 MG PO TABS: 500.0000 mg | ORAL_TABLET | Freq: Two times a day (BID) | ORAL | 0 refills | Status: DC

## 2018-11-11 MED ORDER — OXYCODONE-ACETAMINOPHEN 5-325 MG PO TABS
1.0000 | ORAL_TABLET | Freq: Once | ORAL | Status: AC
  Administered 2018-11-11: 1 via ORAL
  Filled 2018-11-11: qty 1

## 2018-11-11 MED ORDER — HYDROCODONE-ACETAMINOPHEN 5-325 MG PO TABS: 2.0000 | ORAL_TABLET | ORAL | 0 refills | Status: DC | PRN

## 2018-11-11 NOTE — Discharge Instructions (Addendum)
Please read attached information. If you experience any new or worsening signs or symptoms please return to the emergency room for evaluation. Please follow-up with your primary care provider or specialist as discussed. Please use medication prescribed only as directed and discontinue taking if you have any concerning signs or symptoms.   °

## 2018-11-11 NOTE — ED Notes (Signed)
Discharge instructions and prescriptions discussed with Pt. Pt verbalized understanding. Pt stable and ambulatory.   

## 2018-11-11 NOTE — ED Triage Notes (Signed)
Pt reports left knee pian 10/10 since July 5th, denies injury. Reports inability to sleep from the pain.

## 2018-11-11 NOTE — ED Provider Notes (Signed)
[facility] EMERGENCY DEPARTMENT Provider Note   CSN: [id number] Arrival date & time: 11/11/18  1725    History   Chief Complaint Chief Complaint  Patient presents with  . Knee Pain    HPI Ian Sparks is a 48 y.o. male.     HPI   48 year old male presents today with complaints of left knee pain.  Patient notes that on 5 July he woke up with pain in his left knee.  He notes reduced range of motion to the knee, pain with range of motion, no significant pain at rest.  He denies any redness fever warmth.  He denies any trauma to the knee.  Reports a history of gout in his right great toe previously but notes this does not feel similar.  No other infectious etiology including penile discharge.    Past Medical History:  Diagnosis Date  . Heart murmur   . Obesity     There are no active problems to display for this patient.   History reviewed. No pertinent surgical history.      Home Medications    Prior to Admission medications   Medication Sig Start Date End Date Taking? Authorizing Provider  benzonatate (TESSALON) 100 MG capsule Take 1 capsule (100 mg total) by mouth 3 (three) times daily as needed for cough. 02/15/18   Fawze, Mina A, PA-C  fluticasone (FLONASE) 50 MCG/ACT nasal spray Place 2 sprays into both nostrils daily. 02/15/18   Fawze, Mina A, PA-C  HYDROcodone-acetaminophen (NORCO/VICODIN) 5-325 MG tablet Take 2 tablets by mouth every 4 (four) hours as needed. 11/11/18   Gemma Ruan, Dellis Filbert, PA-C  lidocaine (XYLOCAINE) 2 % solution Use as directed 15 mLs in the mouth or throat as needed for mouth pain. 04/14/18   Recardo Evangelist, PA-C  naproxen (NAPROSYN) 500 MG tablet Take 1 tablet (500 mg total) by mouth 2 (two) times daily. 11/11/18   Derenda Giddings, Dellis Filbert, PA-C  phenol (CHLORASEPTIC) 1.4 % LIQD Use as directed 1 spray in the mouth or throat as needed for throat irritation / pain. 02/15/18   Renita Papa, PA-C    Family History History  reviewed. No pertinent family history.  Social History Social History   Tobacco Use  . Smoking status: Never Smoker  . Smokeless tobacco: Never Used  Substance Use Topics  . Alcohol use: Yes    Comment: occasional   . Drug use: No     Allergies   Aspirin   Review of Systems Review of Systems  All other systems reviewed and are negative.    Physical Exam Updated Vital Signs BP 105/65 (BP Location: Right Arm)   Pulse 75   Temp 98.1 F (36.7 C) (Oral)   Resp 18   SpO2 96%   Physical Exam Vitals signs and nursing note reviewed.  Constitutional:      Appearance: He is well-developed.  HENT:     Head: Normocephalic and atraumatic.  Eyes:     General: No scleral icterus.       Right eye: No discharge.        Left eye: No discharge.     Conjunctiva/sclera: Conjunctivae normal.     Pupils: Pupils are equal, round, and reactive to light.  Neck:     Musculoskeletal: Normal range of motion.     Vascular: No JVD.     Trachea: No tracheal deviation.  Pulmonary:     Effort: Pulmonary effort is normal.     Breath sounds: No  stridor.  Musculoskeletal:     Comments: Left knee with minor swelling, no redness warmth to touch or tenderness to palpation of the knee, pain with anterior posterior tibial translation, with varus stress, no laxity of the knee, no wounds-no swelling to the calf or lower extremity-range of motion to approximately 100 degrees flexion   Neurological:     Mental Status: He is alert and oriented to person, place, and time.     Coordination: Coordination normal.  Psychiatric:        Behavior: Behavior normal.        Thought Content: Thought content normal.        Judgment: Judgment normal.     ED Treatments / Results  Labs (all labs ordered are listed, but only abnormal results are displayed) Labs Reviewed - No data to display  EKG None  Radiology No results found.  Procedures Procedures (including critical care time)  Medications Ordered  in ED Medications - No data to display   Initial Impression / Assessment and Plan / ED Course  I have reviewed the triage vital signs and the nursing notes.  Pertinent labs & imaging results that were available during my care of the patient were reviewed by me and considered in my medical decision making (see chart for details).        48 year old male presents today with complaints of left knee pain.  Patient does have swelling to the knee this is not red or hot, he is able to flex it but not quite till 90 degrees.  I have very low suspicion for infectious etiology including septic arthritis, no signs of gout.  I question internal damage causing ongoing swelling.  Patient will have plain films here.  Patient will be given pain medication encouraged use anti-inflammatories and referred to orthopedics for ongoing evaluation management.  He is given strict return precautions.  Verbalized understanding and agreement to today's plan had no further questions concerns at the time of discharge.  Final Clinical Impressions(s) / ED Diagnoses   Final diagnoses:  Acute pain of left knee    ED Discharge Orders         Ordered    HYDROcodone-acetaminophen (NORCO/VICODIN) 5-325 MG tablet  Every 4 hours PRN     11/11/18 1811    naproxen (NAPROSYN) 500 MG tablet  2 times daily     11/11/18 1811           Rosalio LoudHedges, Decarla Siemen, PA-C 11/11/18 Marianna [id number]    Pfeiffer, Marcy, MD 11/16/18 [phone]

## 2018-11-12 ENCOUNTER — Telehealth: Payer: Self-pay | Admitting: *Deleted

## 2018-11-12 NOTE — Telephone Encounter (Signed)
TOC CM received call from pt stating [facility] states they did not receive his escribed Rx for Hydrocodone/Acetaminophen. Contacted pharmacy and states it did not come through. Contacted Dr Wilson Singer and will provide pt with a Rx to take to his pharmacy. Pt will come to [facility] ED to pick up. Pharmacy states they will take a written Rx for narcotics. Jonnie Finner RN CCM Case Mgmt phone [phone]

## 2019-08-20 IMAGING — CT CT HEAD W/O CM
3 series · 16 of 47 positions shown, 19 images · non-contrast
Comparison: None.

CLINICAL DATA: Headache, dizziness.

EXAM:
CT HEAD WITHOUT CONTRAST
TECHNIQUE: Contiguous axial images were obtained from the base of the skull
through the vertex without intravenous contrast.

[Series 2: head wo · axial · 0.49mm/px · z∈[-101,+34]mm · 10 of 33 slices shown, 13 images]
[im 3/33  brain]
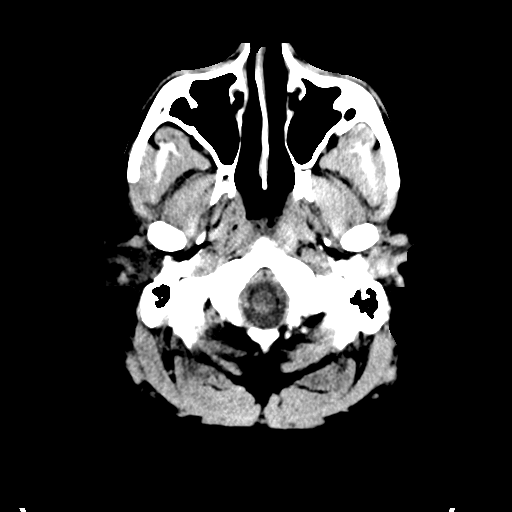
[im 3/33  bone]
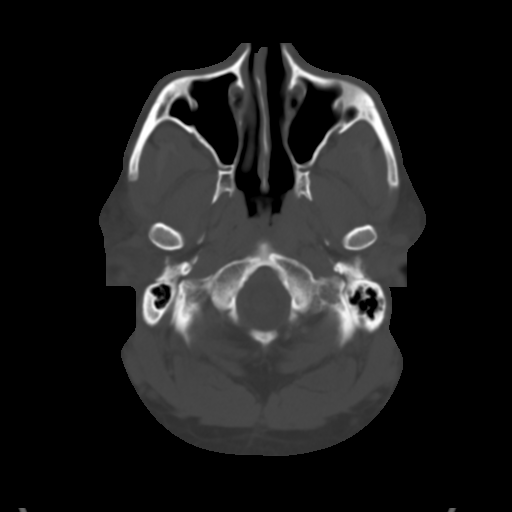
[im 6/33  brain]
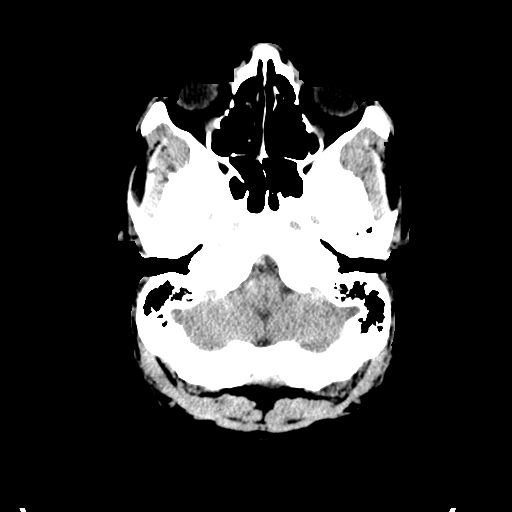
[im 9/33  brain]
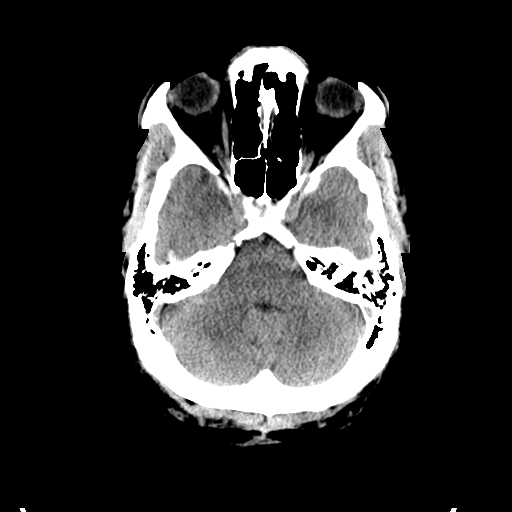
[im 12/33  brain]
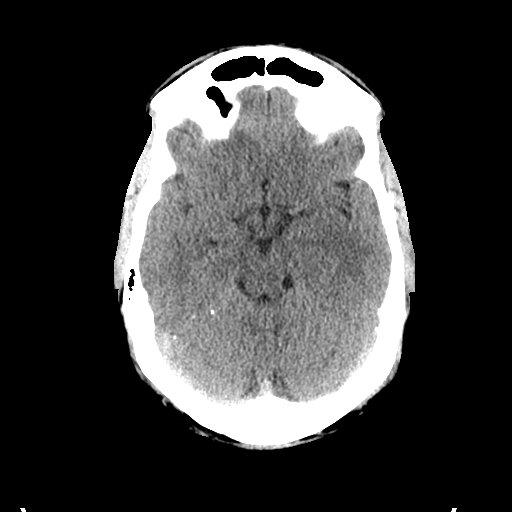
[im 15/33  brain]
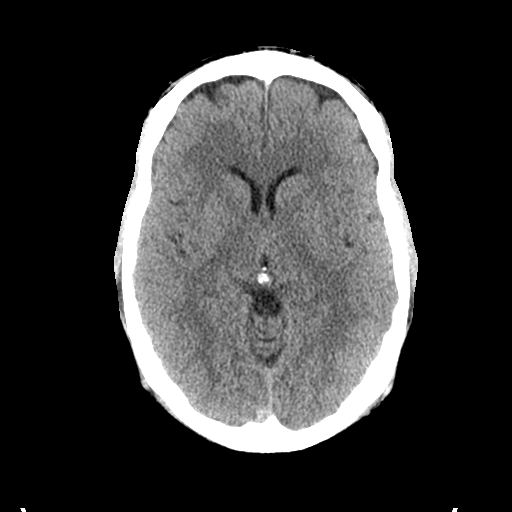
[im 15/33  bone]
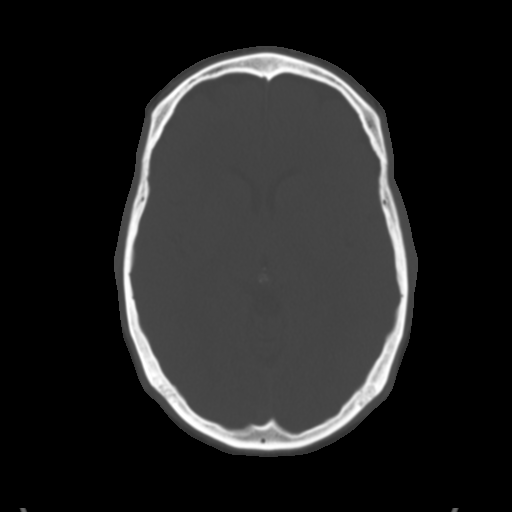
[im 18/33  brain]
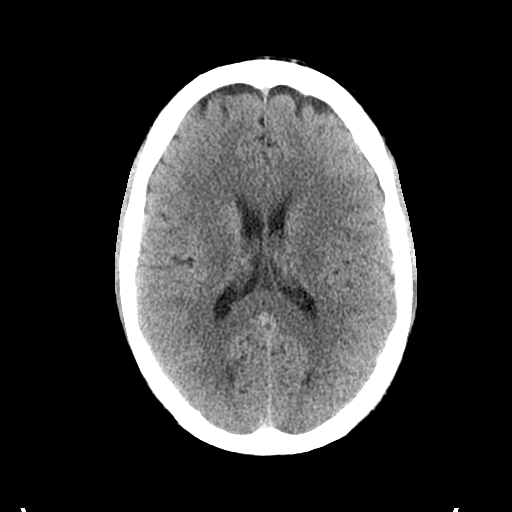
[im 21/33  brain]
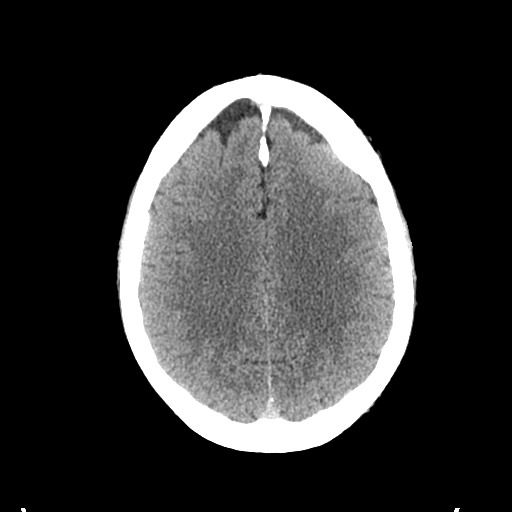
[im 25/33  brain]
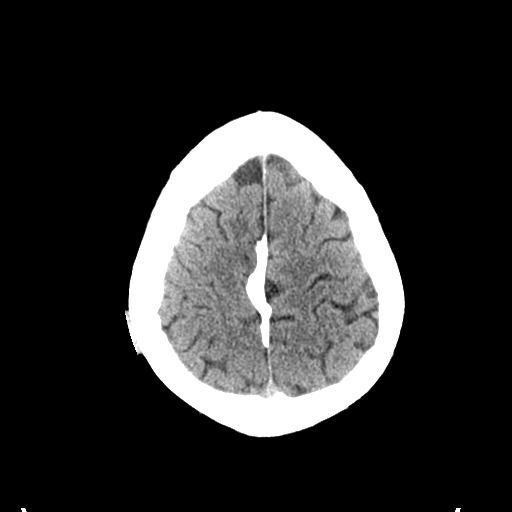
[im 27/33  brain]
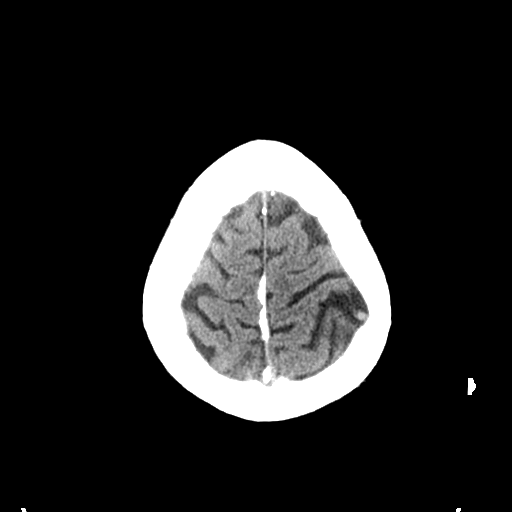
[im 27/33  bone]
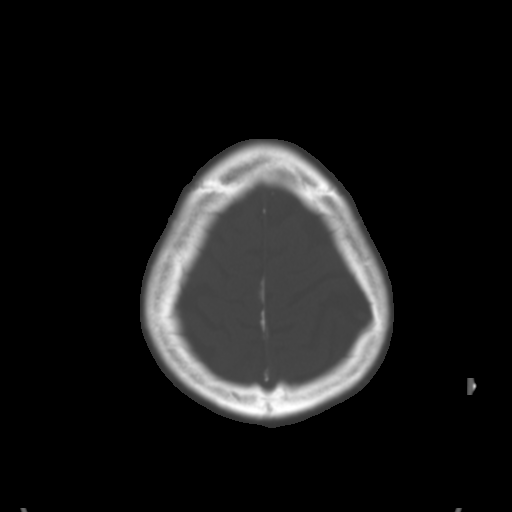
[im 30/33  brain]
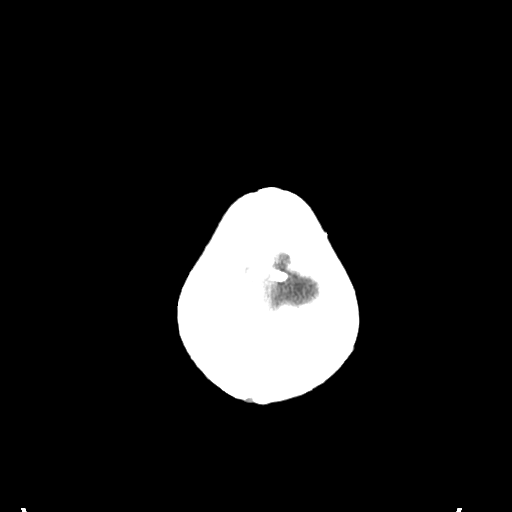

[Series 4: coronal soft tissue · coronal · 0.32mm/px · 3 of 69 slices shown]
[im 23/69  brain]
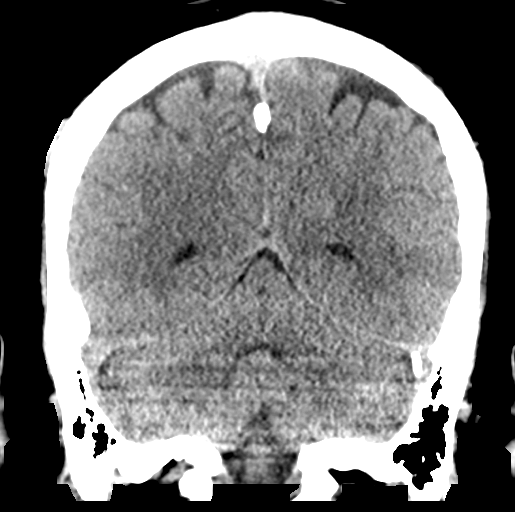
[im 31/69  brain]
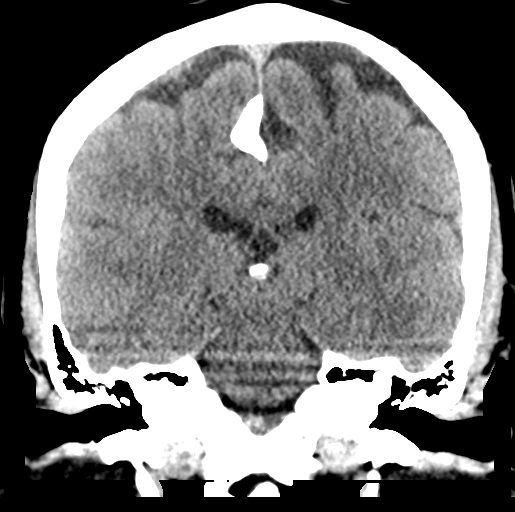
[im 38/69  brain]
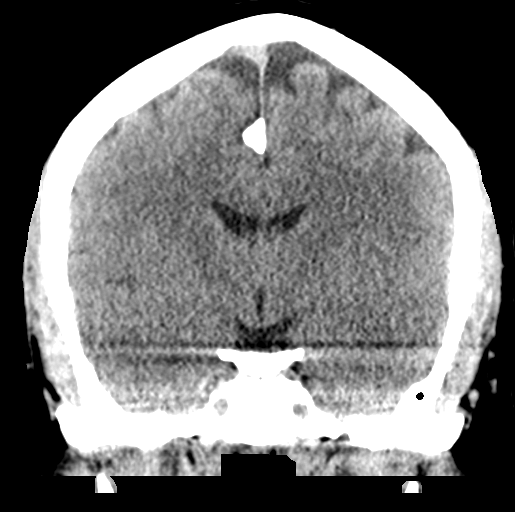

[Series 5: sagittal soft tissue · sagittal · 0.32mm/px · 3 of 51 slices shown]
[im 17/51  brain]
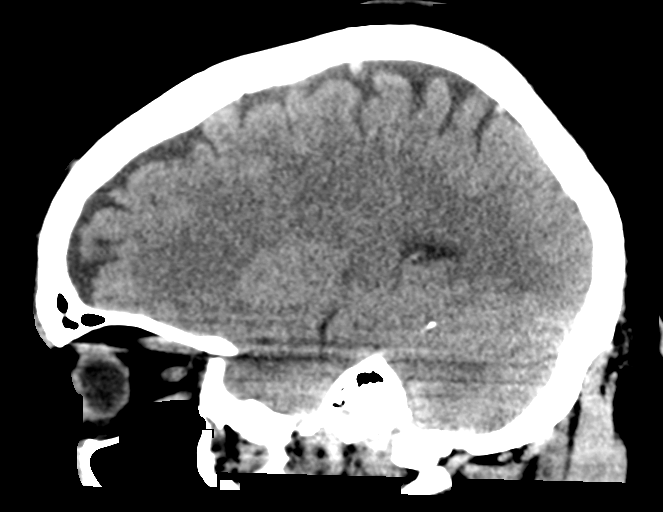
[im 26/51  brain]
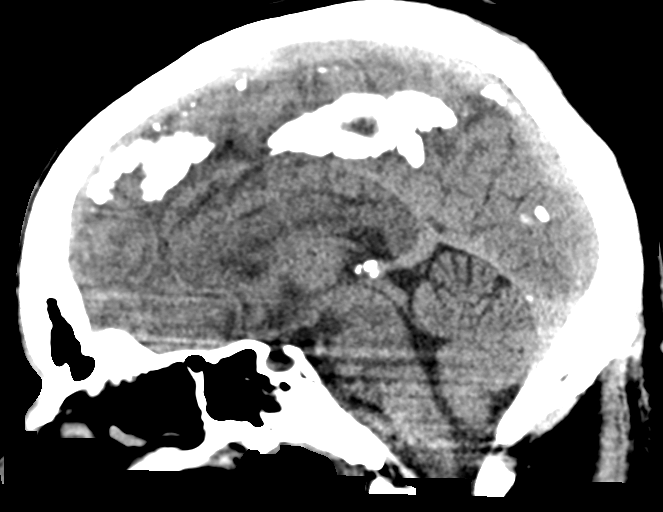
[im 34/51  brain]
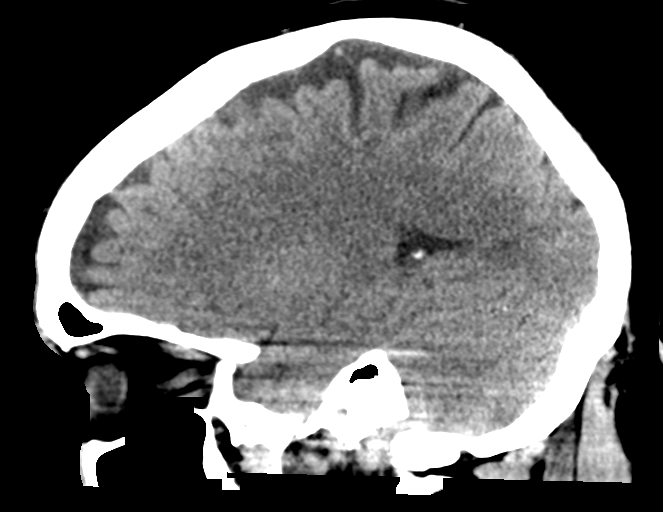

[16 of 47 positions shown; findings below may reference images not displayed]

FINDINGS: Brain: No evidence of acute infarction, hemorrhage, hydrocephalus,
extra-axial collection or mass lesion/mass effect.

Vascular: No hyperdense vessel or unexpected calcification.

Skull: Normal. Negative for fracture or focal lesion.

Sinuses/Orbits: No acute finding.

Other: None.
IMPRESSION: Normal head CT.

## 2020-01-04 ENCOUNTER — Other Ambulatory Visit: Payer: Self-pay

## 2020-01-04 ENCOUNTER — Emergency Department (HOSPITAL_COMMUNITY)
Admission: EM | Admit: 2020-01-04 | Discharge: 2020-01-04 | Disposition: A | Discharge status: Home / Self Care | Payer: Self-pay | Attending: Emergency Medicine | Admitting: Emergency Medicine

## 2020-01-04 ENCOUNTER — Emergency Department (HOSPITAL_COMMUNITY): Payer: Self-pay

## 2020-01-04 DIAGNOSIS — W57XXXA Bitten or stung by nonvenomous insect and other nonvenomous arthropods, initial encounter: Secondary | ICD-10-CM

## 2020-01-04 DIAGNOSIS — Z79899 Other long term (current) drug therapy: Secondary | ICD-10-CM | POA: Insufficient documentation

## 2020-01-04 DIAGNOSIS — M21071 Valgus deformity, not elsewhere classified, right ankle: Secondary | ICD-10-CM

## 2020-01-04 MED ORDER — NAPROXEN 375 MG PO TABS: 375.0000 mg | ORAL_TABLET | Freq: Two times a day (BID) | ORAL | 0 refills | Status: DC

## 2020-01-04 NOTE — ED Provider Notes (Signed)
[facility] EMERGENCY DEPARTMENT Provider Note   CSN: [id number] Arrival date & time: 01/04/20  1348     History No chief complaint on file.   Ian Sparks is a 49 y.o. male.  Patient reports being bitten by unknown insect on the left lateral aspect of his abdomen with resultant swelling and increased warmth of abdominal skin. Patient treated with alcohol and benadryl, and symptoms have markedly improved. At about the same time, patient developed pain in his right great toe. No known injury.  The history is provided by the patient. No language interpreter was used.  Foot Pain This is a new problem. The current episode started more than 2 days ago. The problem occurs constantly. The problem has been gradually worsening. The symptoms are aggravated by walking. Nothing relieves the symptoms. He has tried rest (hydrocodone) for the symptoms. The treatment provided no relief.       Past Medical History:  Diagnosis Date  . Heart murmur   . Obesity     There are no problems to display for this patient.   No past surgical history on file.     No family history on file.  Social History   Tobacco Use  . Smoking status: Never Smoker  . Smokeless tobacco: Never Used  Vaping Use  . Vaping Use: Never used  Substance Use Topics  . Alcohol use: Yes    Comment: occasional   . Drug use: No    Home Medications Prior to Admission medications   Medication Sig Start Date End Date Taking? Authorizing Provider  benzonatate (TESSALON) 100 MG capsule Take 1 capsule (100 mg total) by mouth 3 (three) times daily as needed for cough. 02/15/18   Fawze, Mina A, PA-C  fluticasone (FLONASE) 50 MCG/ACT nasal spray Place 2 sprays into both nostrils daily. 02/15/18   Fawze, Mina A, PA-C  HYDROcodone-acetaminophen (NORCO/VICODIN) 5-325 MG tablet Take 2 tablets by mouth every 4 (four) hours as needed. 11/11/18   Hedges, Tinnie Gens, PA-C  lidocaine (XYLOCAINE) 2 % solution Use  as directed 15 mLs in the mouth or throat as needed for mouth pain. 04/14/18   Bethel Born, PA-C  naproxen (NAPROSYN) 500 MG tablet Take 1 tablet (500 mg total) by mouth 2 (two) times daily. 11/11/18   Hedges, Tinnie Gens, PA-C  phenol (CHLORASEPTIC) 1.4 % LIQD Use as directed 1 spray in the mouth or throat as needed for throat irritation / pain. 02/15/18   Fawze, Mina A, PA-C    Allergies    Aspirin  Review of Systems   Review of Systems  Constitutional: Positive for fever.  Musculoskeletal: Positive for arthralgias.  All other systems reviewed and are negative.   Physical Exam Updated Vital Signs BP (!) 153/77 (BP Location: Right Arm)   Pulse 68   Temp 98.9 F (37.2 C) (Oral)   Resp 16   Ht 6\' 1"  (1.854 m)   Wt (!) 166.9 kg   SpO2 96%   BMI 48.55 kg/m   Physical Exam Vitals and nursing note reviewed.  Constitutional:      Appearance: He is obese.  HENT:     Head: Normocephalic.     Mouth/Throat:     Pharynx: Oropharynx is clear.  Eyes:     Conjunctiva/sclera: Conjunctivae normal.  Cardiovascular:     Rate and Rhythm: Normal rate.  Pulmonary:     Effort: Pulmonary effort is normal.  Abdominal:     General: There is no distension.  Palpations: Abdomen is soft.     Tenderness: There is no abdominal tenderness.     Comments: No current redness, increased warmth or edema noted.   Musculoskeletal:        General: Tenderness present.     Cervical back: Normal range of motion and neck supple.     Comments: Tenderness overlying the great toe. Mild increased warmth, no redness.  Skin:    General: Skin is warm and dry.  Neurological:     Mental Status: He is alert and oriented to person, place, and time.  Psychiatric:        Mood and Affect: Mood normal.     ED Results / Procedures / Treatments   Labs (all labs ordered are listed, but only abnormal results are displayed) Labs Reviewed - No data to display  EKG None  Radiology DG Foot Complete  Right  Result Date: 01/04/2020 CLINICAL DATA:  Swelling of the great toe.  No known injury. EXAM: RIGHT FOOT COMPLETE - 3+ VIEW COMPARISON:  None. FINDINGS: Hallux valgus deformity with adjacent soft tissue swelling. No evidence of erosion. Ordinary mild midfoot degenerative changes. No traumatic finding. IMPRESSION: Hallux valgus deformity with adjacent soft tissue swelling. Ordinary mild midfoot degenerative changes. No erosion or acute traumatic finding. Electronically Signed   By: Paulina Fusi M.D.   On: 01/04/2020 14:29    Procedures Procedures (including critical care time)  Medications Ordered in ED Medications - No data to display  ED Course  I have reviewed the triage vital signs and the nursing notes.  Pertinent labs & imaging results that were available during my care of the patient were reviewed by me and considered in my medical decision making (see chart for details).    MDM Rules/Calculators/A&P                          Patient X-Ray negative for obvious fracture or dislocation. Hallus valgus deformity noted. Low suspicion for septic joint. Discussed treatment options with patient.  Pt advised to follow up with orthopedics and/or podiatry.. Patient given crutches while in ED, conservative therapy recommended and discussed. Patient will be discharged home & is agreeable with above plan. Returns precautions discussed. Pt appears safe for discharge. Final Clinical Impression(s) / ED Diagnoses Final diagnoses:  Valgus deformity of foot, right    Rx / DC Orders ED Discharge Orders    None       Felicie Morn, NP 01/04/20 1724    Tegeler, Canary Brim, MD 01/04/20 1728

## 2020-01-04 NOTE — ED Notes (Signed)
RN called ortho for bariatric crutches.

## 2020-01-04 NOTE — ED Triage Notes (Signed)
Pt arrives for eval of bug bite to left abdomen that has caused swelling in his left abdomen since Sunday. Also reports R ankle swelling, denies injury.

## 2020-01-04 NOTE — Discharge Instructions (Addendum)
Please follow-up with podiatry and/or orthopedics as discussed.

## 2020-01-04 NOTE — Progress Notes (Signed)
Orthopedic Tech Progress Note Patient Details:  Ian Sparks [place] 1970/08/14 [id number]  Ortho Devices Type of Ortho Device: Crutches, Postop shoe/boot Ortho Device/Splint Location: RLE Ortho Device/Splint Interventions: Ordered, Application, Adjustment   Post Interventions Patient Tolerated: Well Instructions Provided: Care of device, Adjustment of device, Poper ambulation with device   Ian Sparks 01/04/2020, 5:57 PM

## 2020-02-06 ENCOUNTER — Emergency Department (HOSPITAL_COMMUNITY)
Admission: EM | Admit: 2020-02-06 | Discharge: 2020-02-07 | Disposition: A | Discharge status: Home / Self Care | Payer: Self-pay | Attending: Emergency Medicine | Admitting: Emergency Medicine

## 2020-02-06 ENCOUNTER — Encounter (HOSPITAL_COMMUNITY): Payer: Self-pay | Admitting: Emergency Medicine

## 2020-02-06 ENCOUNTER — Other Ambulatory Visit: Payer: Self-pay

## 2020-02-06 DIAGNOSIS — R519 Headache, unspecified: Secondary | ICD-10-CM | POA: Insufficient documentation

## 2020-02-06 DIAGNOSIS — Z5321 Procedure and treatment not carried out due to patient leaving prior to being seen by health care provider: Secondary | ICD-10-CM | POA: Insufficient documentation

## 2020-02-06 NOTE — ED Triage Notes (Signed)
Patient reports headache onset last week , denies head injury , no emesis or photophobia , denies fever or chills , patient added nasal congestion .

## 2020-02-07 NOTE — ED Notes (Signed)
Patient stated after triage that he would wait in his car. Called personal phone to see if he was still waiting in car. No answer

## 2020-02-07 NOTE — ED Notes (Signed)
No answer x1 for vitals recheck 

## 2021-04-06 ENCOUNTER — Emergency Department (HOSPITAL_COMMUNITY)
Admission: EM | Admit: 2021-04-06 | Discharge: 2021-04-06 | Disposition: A | Discharge status: Home / Self Care | Payer: Self-pay | Attending: Emergency Medicine | Admitting: Emergency Medicine

## 2021-04-06 ENCOUNTER — Encounter (HOSPITAL_COMMUNITY): Payer: Self-pay

## 2021-04-06 ENCOUNTER — Emergency Department (HOSPITAL_COMMUNITY): Payer: Self-pay

## 2021-04-06 ENCOUNTER — Other Ambulatory Visit: Payer: Self-pay

## 2021-04-06 DIAGNOSIS — U071 COVID-19: Secondary | ICD-10-CM

## 2021-04-06 DIAGNOSIS — K0889 Other specified disorders of teeth and supporting structures: Secondary | ICD-10-CM | POA: Insufficient documentation

## 2021-04-06 DIAGNOSIS — R072 Precordial pain: Secondary | ICD-10-CM

## 2021-04-06 DIAGNOSIS — D696 Thrombocytopenia, unspecified: Secondary | ICD-10-CM

## 2021-04-06 DIAGNOSIS — K047 Periapical abscess without sinus: Secondary | ICD-10-CM

## 2021-04-06 LAB — CBC
HCT: 46.5 % (ref 39.0–52.0)
Hemoglobin: 14.2 g/dL (ref 13.0–17.0)
MCH: 26.2 pg (ref 26.0–34.0)
MCHC: 30.5 g/dL (ref 30.0–36.0)
MCV: 85.6 fL (ref 80.0–100.0)
Platelets: 125 10*3/uL — ABNORMAL LOW (ref 150–400)
RBC: 5.43 MIL/uL (ref 4.22–5.81)
RDW: 12.9 % (ref 11.5–15.5)
WBC: 7.4 10*3/uL (ref 4.0–10.5)
nRBC: 0 % (ref 0.0–0.2)

## 2021-04-06 LAB — RESP PANEL BY RT-PCR (FLU A&B, COVID) ARPGX2
Influenza A by PCR: NEGATIVE
Influenza B by PCR: NEGATIVE
SARS Coronavirus 2 by RT PCR: POSITIVE — AB

## 2021-04-06 LAB — TROPONIN I (HIGH SENSITIVITY)
Troponin I (High Sensitivity): 9 ng/L (ref <18)
Troponin I (High Sensitivity): 11 ng/L (ref <18)

## 2021-04-06 LAB — BASIC METABOLIC PANEL
Anion gap: 7 (ref 5–15)
BUN: 9 mg/dL (ref 6–20)
CO2: 25 mmol/L (ref 22–32)
Calcium: 8.8 mg/dL — ABNORMAL LOW (ref 8.9–10.3)
Chloride: 103 mmol/L (ref 98–111)
Creatinine, Ser: 1.11 mg/dL (ref 0.61–1.24)
GFR, Estimated: >60 mL/min (ref >60)
Glucose, Bld: 118 mg/dL — ABNORMAL HIGH (ref 70–99)
Potassium: 4.1 mmol/L (ref 3.5–5.1)
Sodium: 135 mmol/L (ref 135–145)

## 2021-04-06 MED ORDER — AMOXICILLIN 500 MG PO CAPS
1000.0000 mg | ORAL_CAPSULE | Freq: Once | ORAL | Status: AC
  Administered 2021-04-06: 1000 mg via ORAL
  Filled 2021-04-06: qty 2

## 2021-04-06 MED ORDER — AMOXICILLIN 500 MG PO CAPS: 500.0000 mg | ORAL_CAPSULE | Freq: Three times a day (TID) | ORAL | 0 refills | Status: DC

## 2021-04-06 MED ORDER — ACETAMINOPHEN 325 MG PO TABS: 650.0000 mg | ORAL_TABLET | Freq: Four times a day (QID) | ORAL | Status: DC | PRN

## 2021-04-06 MED ORDER — MOLNUPIRAVIR EUA 200MG CAPSULE: 4.0000 | ORAL_CAPSULE | Freq: Two times a day (BID) | ORAL | 0 refills | Status: AC

## 2021-04-06 MED ORDER — ACETAMINOPHEN 500 MG PO TABS
1000.0000 mg | ORAL_TABLET | Freq: Once | ORAL | Status: AC
  Administered 2021-04-06: 1000 mg via ORAL
  Filled 2021-04-06: qty 2

## 2021-04-06 MED ORDER — DICLOFENAC SODIUM 75 MG PO TBEC
75.0000 mg | DELAYED_RELEASE_TABLET | Freq: Two times a day (BID) | ORAL | 0 refills | Status: DC | PRN
Start: 2021-04-06 — End: 2021-08-06

## 2021-04-06 NOTE — ED Provider Notes (Addendum)
[facility] EMERGENCY DEPARTMENT Provider Note   CSN: [id number] Arrival date & time: 04/06/21  1734     History Chief Complaint  Patient presents with   Chest Pain   Dental Pain    Shaheed Schmuck is a 50 y.o. male.  Pt c/o mid to left chest pain earlier this morning, and also right toothache for the past several days. Symptoms acute onset, moderate, constant, persistent, dull, non radiating. No associated sob, nv or diaphoresis. No pleuritic pain. Denies any other recent cp or discomfort, even w exertion. No hx cad or fam hx cad. Denies hx dvt or pe. No leg pain or swelling. No recent immobility, trauma, travel or surgery. Also notes recent fever. Occasional non prod cough. No sore throat or trouble swallowing. No specific known ill contact or known covid/flu exposure. No abd pain or nvd. No dysuria or gu c/o. No skin/foot/extremity pain, swelling or ulcers/lesions.   The history is provided by the patient and medical records.  Chest Pain Associated symptoms: cough and fever   Associated symptoms: no abdominal pain, no back pain, no headache, no palpitations, no shortness of breath and no vomiting   Dental Pain Associated symptoms: fever   Associated symptoms: no headaches and no neck pain       Past Medical History:  Diagnosis Date   Heart murmur    Obesity     There are no problems to display for this patient.   History reviewed. No pertinent surgical history.     History reviewed. No pertinent family history.  Social History   Tobacco Use   Smoking status: Never   Smokeless tobacco: Never  Vaping Use   Vaping Use: Never used  Substance Use Topics   Alcohol use: Yes    Comment: occasional    Drug use: No    Home Medications Prior to Admission medications   Medication Sig Start Date End Date Taking? Authorizing Provider  benzonatate (TESSALON) 100 MG capsule Take 1 capsule (100 mg total) by mouth 3 (three) times daily as needed  for cough. 02/15/18   Fawze, Mina A, PA-C  fluticasone (FLONASE) 50 MCG/ACT nasal spray Place 2 sprays into both nostrils daily. 02/15/18   Fawze, Mina A, PA-C  HYDROcodone-acetaminophen (NORCO/VICODIN) 5-325 MG tablet Take 2 tablets by mouth every 4 (four) hours as needed. 11/11/18   Hedges, Tinnie Gens, PA-C  lidocaine (XYLOCAINE) 2 % solution Use as directed 15 mLs in the mouth or throat as needed for mouth pain. 04/14/18   Bethel Born, PA-C  naproxen (NAPROSYN) 375 MG tablet Take 1 tablet (375 mg total) by mouth 2 (two) times daily. 01/04/20   Felicie Morn, NP  phenol (CHLORASEPTIC) 1.4 % LIQD Use as directed 1 spray in the mouth or throat as needed for throat irritation / pain. 02/15/18   Fawze, Mina A, PA-C    Allergies    Aspirin  Review of Systems   Review of Systems  Constitutional:  Positive for fever.  HENT:  Negative for ear pain and sore throat.   Eyes:  Negative for redness.  Respiratory:  Positive for cough. Negative for shortness of breath.   Cardiovascular:  Positive for chest pain. Negative for palpitations and leg swelling.  Gastrointestinal:  Negative for abdominal pain, diarrhea and vomiting.  Genitourinary:  Negative for dysuria and flank pain.  Musculoskeletal:  Negative for back pain, neck pain and neck stiffness.  Skin:  Negative for rash.  Neurological:  Negative for headaches.  Hematological:  Does not bruise/bleed easily.  Psychiatric/Behavioral:  Negative for confusion.    Physical Exam Updated Vital Signs BP (!) 177/85 (BP Location: Left Arm)    Pulse 89    Temp (!) 101.1 F (38.4 C) (Oral)    Resp 18    Ht 1.854 m (6\' 1" )    Wt (!) 167.4 kg    SpO2 94%    BMI 48.68 kg/m   Physical Exam Vitals and nursing note reviewed.  Constitutional:      Appearance: Normal appearance. He is well-developed.  HENT:     Head: Atraumatic.     Nose: Nose normal.     Mouth/Throat:     Mouth: Mucous membranes are moist.     Pharynx: Oropharynx is clear.      Comments: Right upper tooth decay, and broke off, w mild gum swelling and tenderness. No trismus. Pharynx normal. No swelling, pain or tenderness to floor of mouth or neck.  Eyes:     General: No scleral icterus.    Conjunctiva/sclera: Conjunctivae normal.     Pupils: Pupils are equal, round, and reactive to light.  Neck:     Trachea: No tracheal deviation.     Comments: Trachea midline. No stiffness or rigidity. No neck mass, pain, or swelling.  Cardiovascular:     Rate and Rhythm: Normal rate and regular rhythm.     Pulses: Normal pulses.     Heart sounds: Normal heart sounds. No murmur heard.   No friction rub. No gallop.  Pulmonary:     Effort: Pulmonary effort is normal. No accessory muscle usage or respiratory distress.     Breath sounds: Normal breath sounds.  Abdominal:     General: Bowel sounds are normal. There is no distension.     Palpations: Abdomen is soft.     Tenderness: There is no abdominal tenderness. There is no guarding.  Genitourinary:    Comments: No cva tenderness. Musculoskeletal:        General: No swelling or tenderness.     Cervical back: Normal range of motion and neck supple. No rigidity.     Right lower leg: No edema.     Left lower leg: No edema.  Lymphadenopathy:     Cervical: No cervical adenopathy.  Skin:    General: Skin is warm and dry.     Findings: No rash.  Neurological:     Mental Status: He is alert.     Comments: Alert, speech clear.   Psychiatric:        Mood and Affect: Mood normal.    ED Results / Procedures / Treatments   Labs (all labs ordered are listed, but only abnormal results are displayed) Results for orders placed or performed during the hospital encounter of 04/06/21  Resp Panel by RT-PCR (Flu A&B, Covid) Nasopharyngeal Swab   Specimen: Nasopharyngeal Swab; Nasopharyngeal(NP) swabs in vial transport medium  Result Value Ref Range   SARS Coronavirus 2 by RT PCR POSITIVE (A) NEGATIVE   Influenza A by PCR NEGATIVE  NEGATIVE   Influenza B by PCR NEGATIVE NEGATIVE  Basic metabolic panel  Result Value Ref Range   Sodium 135 135 - 145 mmol/L   Potassium 4.1 3.5 - 5.1 mmol/L   Chloride 103 98 - 111 mmol/L   CO2 25 22 - 32 mmol/L   Glucose, Bld 118 (H) 70 - 99 mg/dL   BUN 9 6 - 20 mg/dL   Creatinine, Ser 04/08/21 0.61 - 1.24 mg/dL  Calcium 8.8 (L) 8.9 - 10.3 mg/dL   GFR, Estimated >29 >52 mL/min   Anion gap 7 5 - 15  CBC  Result Value Ref Range   WBC 7.4 4.0 - 10.5 K/uL   RBC 5.43 4.22 - 5.81 MIL/uL   Hemoglobin 14.2 13.0 - 17.0 g/dL   HCT 84.1 32.4 - 40.1 %   MCV 85.6 80.0 - 100.0 fL   MCH 26.2 26.0 - 34.0 pg   MCHC 30.5 30.0 - 36.0 g/dL   RDW 02.7 25.3 - 66.4 %   Platelets 125 (L) 150 - 400 K/uL   nRBC 0.0 0.0 - 0.2 %  Troponin I (High Sensitivity)  Result Value Ref Range   Troponin I (High Sensitivity) 9 <18 ng/L  Troponin I (High Sensitivity)  Result Value Ref Range   Troponin I (High Sensitivity) 11 <18 ng/L     EKG EKG Interpretation  Date/Time:  Saturday April 06 2021 18:24:43 EST Ventricular Rate:  85 PR Interval:  146 QRS Duration: 111 QT Interval:  347 QTC Calculation: 413 R Axis:   -64 Text Interpretation: Sinus rhythm Left anterior fascicular block Nonspecific T wave abnormality Confirmed by Cathren Laine ([id number]) on 04/06/2021 7:10:19 PM  Radiology DG Chest 2 View  Result Date: 04/06/2021 CLINICAL DATA:  Chest pain. EXAM: CHEST - 2 VIEW COMPARISON:  Chest x-ray 04/14/2018. FINDINGS: The heart size and mediastinal contours are within normal limits. Both lungs are clear. The visualized skeletal structures are unremarkable. IMPRESSION: No active cardiopulmonary disease. Electronically Signed   By: Darliss Cheney M.D.   On: 04/06/2021 18:50    Procedures Procedures   Medications Ordered in ED Medications  acetaminophen (TYLENOL) tablet 650 mg (has no administration in time range)  acetaminophen (TYLENOL) tablet 1,000 mg (has no administration in time range)   amoxicillin (AMOXIL) capsule 1,000 mg (has no administration in time range)    ED Course  I have reviewed the triage vital signs and the nursing notes.  Pertinent labs & imaging results that were available during my care of the patient were reviewed by me and considered in my medical decision making (see chart for details).    MDM Rules/Calculators/A&P                         Labs sent. Ecg. Cxr. Continuous pulse ox and cardiac monitoring.   Reviewed nursing notes and prior charts for additional history.   Labs reviewed/interpreted by me - wbc and hgb normal. Trop normal.   CXR reviewed/interpreted by me - no pna.   Acetaminophen po, amoxicillin po.   Recheck pt comfortable. No chest pain. No sob or increased wob.   Additional labs reviewed/interpreted by me - covid is positive. Discussed w pt. Pt indicates he's had covid vaccine x 2. Will give rx molnupiravir. Delta trop normal.   Patient without cp, no sob.   Pt currently appears stable for d/c.  Pt does ask for refill/new rx for his diclofenac.   Return precautions provided.        Final Clinical Impression(s) / ED Diagnoses Final diagnoses:  None    Rx / DC Orders ED Discharge Orders     None            Cathren Laine, MD 04/06/21 2112

## 2021-04-06 NOTE — ED Triage Notes (Signed)
Pt arrived POV from home c/o CP that started this morning. Pt is also c/o of a toothache. Pt states his tooth broke off Thursday while eating a cheese steak. Pt c/o of a headache as well.

## 2021-04-06 NOTE — Discharge Instructions (Addendum)
It was our pleasure to provide your ER care today - we hope that you feel better.  Your covid test is positive - see attached info. Take molnupiravir as prescribed.   For dental issue, you may take your diclofenac as need. Take antibiotic as prescribed. Follow up with dentist in the next 2-3 days - call office Monday AM to arrange appointment.   For recent chest pain, follow up with cardiologist in the next 2-3 weeks - call office to arrange appointment.   Your platelet count is mildly low (125) - follow up with primary care doctor in the next few weeks.   Return to ER if worse, new symptoms, increased trouble breathing, recurrent/persistent chest pain, facial swelling/redness, trouble breathing or swallowing, or other concern.

## 2021-04-06 NOTE — ED Provider Notes (Signed)
Emergency Medicine Provider Triage Evaluation Note  Ian Sparks , a 50 y.o. male  was evaluated in triage.  Pt complains of fever. Left sided chest pain and a broken tooth   Review of Systems  Positive: Fever 101 Negative: cough  Physical Exam  BP (!) 177/85 (BP Location: Left Arm)    Pulse 89    Temp (!) 101.1 F (38.4 C) (Oral)    Resp 18    SpO2 94%  Gen:   Awake, no distress   Resp:  Normal effort  MSK:   Moves extremities without difficulty  Other:  Multiple broken decayed teeth   Medical Decision Making  Medically screening exam initiated at 5:59 PM.  Appropriate orders placed.  August Luz Semmel was informed that the remainder of the evaluation will be completed by another provider, this initial triage assessment does not replace that evaluation, and the importance of remaining in the ED until their evaluation is complete.  EKG, chest xray and labs ordered tylenol for fever   Osie Cheeks 04/06/21 1801    Vanetta Mulders, MD 04/10/21 1324

## 2021-07-18 ENCOUNTER — Encounter (HOSPITAL_BASED_OUTPATIENT_CLINIC_OR_DEPARTMENT_OTHER): Payer: Self-pay | Admitting: Emergency Medicine

## 2021-07-18 ENCOUNTER — Other Ambulatory Visit: Payer: Self-pay

## 2021-07-18 DIAGNOSIS — Z23 Encounter for immunization: Secondary | ICD-10-CM | POA: Insufficient documentation

## 2021-07-18 DIAGNOSIS — B86 Scabies: Secondary | ICD-10-CM | POA: Insufficient documentation

## 2021-07-18 NOTE — ED Triage Notes (Signed)
Presents for wound to l lateral foot that started after going to get a pedicure 2 wks ago.  Pain wakes him up at night, has kept him home from work d/t throbbing in limb. Denies wound drainage. Denies heat in area or swelling.  ?Denies h/o DM ?

## 2021-07-19 ENCOUNTER — Emergency Department (HOSPITAL_BASED_OUTPATIENT_CLINIC_OR_DEPARTMENT_OTHER)
Admission: EM | Admit: 2021-07-19 | Discharge: 2021-07-19 | Disposition: A | Discharge status: Home / Self Care | Payer: Self-pay | Attending: Emergency Medicine | Admitting: Emergency Medicine

## 2021-07-19 ENCOUNTER — Encounter (HOSPITAL_BASED_OUTPATIENT_CLINIC_OR_DEPARTMENT_OTHER): Payer: Self-pay | Admitting: Emergency Medicine

## 2021-07-19 ENCOUNTER — Emergency Department (HOSPITAL_BASED_OUTPATIENT_CLINIC_OR_DEPARTMENT_OTHER): Payer: Self-pay | Admitting: Radiology

## 2021-07-19 DIAGNOSIS — R234 Changes in skin texture: Secondary | ICD-10-CM

## 2021-07-19 MED ORDER — TETANUS-DIPHTH-ACELL PERTUSSIS 5-2.5-18.5 LF-MCG/0.5 IM SUSY
0.5000 mL | PREFILLED_SYRINGE | Freq: Once | INTRAMUSCULAR | Status: AC
  Administered 2021-07-19: 0.5 mL via INTRAMUSCULAR
  Filled 2021-07-19: qty 0.5

## 2021-07-19 MED ORDER — DOXYCYCLINE HYCLATE 100 MG PO TABS
100.0000 mg | ORAL_TABLET | Freq: Once | ORAL | Status: AC
  Administered 2021-07-19: 100 mg via ORAL
  Filled 2021-07-19: qty 1

## 2021-07-19 MED ORDER — DOXYCYCLINE HYCLATE 100 MG PO CAPS: 100.0000 mg | ORAL_CAPSULE | Freq: Two times a day (BID) | ORAL | 0 refills | Status: DC

## 2021-07-19 MED ORDER — IBUPROFEN 800 MG PO TABS
800.0000 mg | ORAL_TABLET | Freq: Once | ORAL | Status: AC
  Administered 2021-07-19: 800 mg via ORAL
  Filled 2021-07-19: qty 1

## 2021-07-19 NOTE — ED Provider Notes (Signed)
?[facility]PT ?Provider Note ? ? ?CSN: [id number] ?Arrival date & time: 07/18/21  2322 ? ?  ? ?History ? ?Chief Complaint  ?Patient presents with  ? Leg Pain  ? ? ?Ian Sparks is a 51 y.o. male. ? ?The history is provided by the patient.  ?Leg Pain ?Location:  Foot ?Time since incident:  4 weeks ?Lower extremity injury: had a pedicure.   ?Foot location:  L foot (lateral foot) ?Pain details:  ?  Quality:  Aching ?  Radiates to:  Does not radiate ?  Severity:  Moderate ?  Onset quality:  Gradual ?  Duration:  1 month ?  Timing:  Constant ?  Progression:  Unchanged ?Chronicity:  New ?Foreign body present:  No foreign bodies ?Tetanus status:  Unknown ?Relieved by:  Nothing ?Worsened by:  Nothing ?Ineffective treatments:  None tried ?Associated symptoms: no decreased ROM and no fever   ?Risk factors: no concern for non-accidental trauma   ? ?  ? ?Home Medications ?Prior to Admission medications   ?Medication Sig Start Date End Date Taking? Authorizing Provider  ?doxycycline (VIBRAMYCIN) 100 MG capsule Take 1 capsule (100 mg total) by mouth 2 (two) times daily. One po bid x 7 days 07/19/21  Yes Argie Lober, MD  ?amoxicillin (AMOXIL) 500 MG capsule Take 1 capsule (500 mg total) by mouth 3 (three) times daily. 04/06/21   Cathren LaineSteinl, Kevin, MD  ?benzonatate (TESSALON) 100 MG capsule Take 1 capsule (100 mg total) by mouth 3 (three) times daily as needed for cough. 02/15/18   Fawze, Mina A, PA-C  ?diclofenac (VOLTAREN) 75 MG EC tablet Take 1 tablet (75 mg total) by mouth 2 (two) times daily as needed. 04/06/21   Cathren LaineSteinl, Kevin, MD  ?fluticasone (FLONASE) 50 MCG/ACT nasal spray Place 2 sprays into both nostrils daily. 02/15/18   Fawze, Mina A, PA-C  ?HYDROcodone-acetaminophen (NORCO/VICODIN) 5-325 MG tablet Take 2 tablets by mouth every 4 (four) hours as needed. 11/11/18   Hedges, Tinnie GensJeffrey, PA-C  ?lidocaine (XYLOCAINE) 2 % solution Use as directed 15 mLs in the mouth or throat as needed for mouth  pain. 04/14/18   Bethel BornGekas, Kelly Marie, PA-C  ?naproxen (NAPROSYN) 375 MG tablet Take 1 tablet (375 mg total) by mouth 2 (two) times daily. 01/04/20   Felicie MornSmith, David, NP  ?phenol (CHLORASEPTIC) 1.4 % LIQD Use as directed 1 spray in the mouth or throat as needed for throat irritation / pain. 02/15/18   Michela PitcherFawze, Mina A, PA-C  ?   ? ?Allergies    ?Aspirin   ? ?Review of Systems   ?Review of Systems  ?Constitutional:  Negative for fever.  ?HENT:  Negative for drooling.   ?Eyes:  Negative for redness.  ?Respiratory:  Negative for wheezing and stridor.   ?Cardiovascular:  Negative for leg swelling.  ?Gastrointestinal:  Negative for abdominal pain.  ?Genitourinary:  Negative for difficulty urinating.  ?Neurological:  Negative for facial asymmetry.  ?Psychiatric/Behavioral:  Negative for agitation.   ?All other systems reviewed and are negative. ? ?Physical Exam ?Updated Vital Signs ?BP (!) 164/86   Pulse 61   Temp 98.7 ?F (37.1 ?C) (Oral)   Resp 18   Wt (!) 163.7 kg   SpO2 97%   BMI 47.63 kg/m?  ?Physical Exam ?Vitals and nursing note reviewed.  ?Constitutional:   ?   General: He is not in acute distress. ?   Appearance: Normal appearance.  ?HENT:  ?   Head: Normocephalic and atraumatic.  ?  Nose: Nose normal.  ?Eyes:  ?   Conjunctiva/sclera: Conjunctivae normal.  ?   Pupils: Pupils are equal, round, and reactive to light.  ?Cardiovascular:  ?   Rate and Rhythm: Normal rate and regular rhythm.  ?   Pulses: Normal pulses.  ?   Heart sounds: Normal heart sounds.  ?Pulmonary:  ?   Effort: Pulmonary effort is normal.  ?   Breath sounds: Normal breath sounds.  ?Abdominal:  ?   General: Bowel sounds are normal.  ?   Palpations: Abdomen is soft.  ?   Tenderness: There is no abdominal tenderness. There is no guarding.  ?Musculoskeletal:     ?   General: Normal range of motion.  ?   Cervical back: Normal range of motion and neck supple.  ?   Left ankle: Normal.  ?   Left Achilles Tendon: Normal.  ?   Left foot: Normal. Normal range  of motion and normal capillary refill. No swelling, deformity, bunion, Charcot foot, foot drop, prominent metatarsal heads, laceration, tenderness, bony tenderness or crepitus. Normal pulse.  ?     Legs: ? ?Skin: ?   General: Skin is warm and dry.  ?   Capillary Refill: Capillary refill takes less than 2 seconds.  ?   Findings: No erythema.  ?Neurological:  ?   General: No focal deficit present.  ?   Mental Status: He is alert and oriented to person, place, and time.  ?   Deep Tendon Reflexes: Reflexes normal.  ?Psychiatric:     ?   Mood and Affect: Mood normal.     ?   Behavior: Behavior normal.  ? ? ?ED Results / Procedures / Treatments   ?Labs ?(all labs ordered are listed, but only abnormal results are displayed) ?Labs Reviewed - No data to display ? ?EKG ?None ? ?Radiology ?DG Foot Complete Left ? ?Result Date: 07/19/2021 ?CLINICAL DATA:  Left foot pain. EXAM: LEFT FOOT - COMPLETE 3+ VIEW COMPARISON:  None. FINDINGS: No acute fracture or dislocation. The bones are well mineralized. Mild degenerative changes and spurring of the dorsal tarsal bones. The soft tissues are unremarkable. IMPRESSION: No acute fracture or dislocation. Electronically Signed   By: Elgie Collard M.D.   On: 07/19/2021 02:20   ? ?Procedures ?Procedures  ? ? ?Medications Ordered in ED ?Medications  ?ibuprofen (ADVIL) tablet 800 mg (has no administration in time range)  ?doxycycline (VIBRA-TABS) tablet 100 mg (has no administration in time range)  ?Tdap (BOOSTRIX) injection 0.5 mL (has no administration in time range)  ? ? ?ED Course/ Medical Decision Making/ A&P ?  ?                        ?Medical Decision Making ?Scab on foot for one month since getting  ? ?Amount and/or Complexity of Data Reviewed ?Radiology: ordered. ?   Details: negative Xray for infection, fracture or gas in soft tissue by me ? ?Risk ?Prescription drug management. ?Risk Details: Have recommended tylenol and ibuprofen and bacitracin to keep scab lubricated.  I will  start doxycycline given his pain but I do not see any signs of acute infection nor abscess nor cellulitis  ? ? ?Final Clinical Impression(s) / ED Diagnoses ?Final diagnoses:  ?Scab  ? ?Return for intractable cough, coughing up blood, fevers > 100.4 unrelieved by medication, shortness of breath, intractable vomiting, chest pain, shortness of breath, weakness, numbness, changes in speech, facial asymmetry, abdominal pain, passing out, Inability  to tolerate liquids or food, cough, altered mental status or any concerns. No signs of systemic illness or infection. The patient is nontoxic-appearing on exam and vital signs are within normal limits.  ?I have reviewed the triage vital signs and the nursing notes. Pertinent labs & imaging results that were available during my care of the patient were reviewed by me and considered in my medical decision making (see chart for details). After history, exam, and medical workup I feel the patient has been appropriately medically screened and is safe for discharge home. Pertinent diagnoses were discussed with the patient. Patient was given return precautions.  ?Rx / DC Orders ?ED Discharge Orders   ? ?      Ordered  ?  doxycycline (VIBRAMYCIN) 100 MG capsule  2 times daily       ? 07/19/21 0320  ? ?  ?  ? ?  ? ? ?  ?Quinlee Sciarra, MD ?07/19/21 8850 ? ?

## 2021-08-05 ENCOUNTER — Emergency Department (HOSPITAL_BASED_OUTPATIENT_CLINIC_OR_DEPARTMENT_OTHER): Payer: Self-pay | Admitting: Radiology

## 2021-08-05 ENCOUNTER — Other Ambulatory Visit: Payer: Self-pay

## 2021-08-05 ENCOUNTER — Encounter (HOSPITAL_BASED_OUTPATIENT_CLINIC_OR_DEPARTMENT_OTHER): Payer: Self-pay | Admitting: Emergency Medicine

## 2021-08-05 DIAGNOSIS — X58XXXA Exposure to other specified factors, initial encounter: Secondary | ICD-10-CM | POA: Insufficient documentation

## 2021-08-05 DIAGNOSIS — S9032XA Contusion of left foot, initial encounter: Secondary | ICD-10-CM | POA: Insufficient documentation

## 2021-08-05 NOTE — ED Triage Notes (Signed)
Presents from home for f/u or L foot pain, was seen 2 weeks ago for same. States that area is now darker and larger, more painful such that it keeps him up at night and has been having to use vacation time for work.   ? ?No known trauma.  ?Has tried motrin and tylenol, ice, warm compresses, and elevation with no relief ? ?Denies fever, chills, numbness in the foot, leg pain, SOB. ?

## 2021-08-06 ENCOUNTER — Emergency Department (HOSPITAL_BASED_OUTPATIENT_CLINIC_OR_DEPARTMENT_OTHER)
Admission: EM | Admit: 2021-08-06 | Discharge: 2021-08-06 | Disposition: A | Discharge status: Home / Self Care | Payer: Self-pay | Attending: Emergency Medicine | Admitting: Emergency Medicine

## 2021-08-06 DIAGNOSIS — M79672 Pain in left foot: Secondary | ICD-10-CM

## 2021-08-06 MED ORDER — NAPROXEN 500 MG PO TABS: 500.0000 mg | ORAL_TABLET | Freq: Two times a day (BID) | ORAL | 0 refills | Status: DC

## 2021-08-06 NOTE — ED Provider Notes (Signed)
? ?  [facility]  ?Provider Note ? ?CSN: [id number] ?Arrival date & time: 08/05/21 2313 ? ?History ?Chief Complaint  ?Patient presents with  ? Foot Pain  ? ? ?Case Ian Sparks is a 51 y.o. male reports worsening left foot pain for the last several weeks. Seen in the ED on 3/31 when it was described as a scab. Given Rx for doxycyline and Motrin which did not help.He reports it has gotten larger and more painful. No fevers. No known injury. He has not been to see PCP or any other doctors since last ED visit.  ? ? ?Home Medications ?Prior to Admission medications   ?Medication Sig Start Date End Date Taking? Authorizing Provider  ?naproxen (NAPROSYN) 500 MG tablet Take 1 tablet (500 mg total) by mouth 2 (two) times daily. 08/06/21   Pollyann Savoy, MD  ? ? ? ?Allergies    ?Aspirin ? ? ?Review of Systems   ?Review of Systems ?Please see HPI for pertinent positives and negatives ? ?Physical Exam ?BP (!) 180/92 (BP Location: Right Wrist)   Pulse 66   Temp 97.9 ?F (36.6 ?C) (Oral)   Resp 20   Wt (!) 163 kg   SpO2 97%   BMI 47.41 kg/m?  ? ?Physical Exam ?Vitals and nursing note reviewed.  ?HENT:  ?   Head: Normocephalic.  ?   Nose: Nose normal.  ?Eyes:  ?   Extraocular Movements: Extraocular movements intact.  ?Pulmonary:  ?   Effort: Pulmonary effort is normal.  ?Musculoskeletal:     ?   General: Normal range of motion.  ?   Cervical back: Neck supple.  ?   Comments: Area of callous and ecchymosis L lateral foot just below the heel, tender to palpation but no fluctuance. See photo  ?Skin: ?   Findings: No rash (on exposed skin).  ?Neurological:  ?   Mental Status: He is alert and oriented to person, place, and time.  ?Psychiatric:     ?   Mood and Affect: Mood normal.  ? ? ? ?ED Results / Procedures / Treatments   ?EKG ?None ? ?Procedures ?Procedures ? ?Medications Ordered in the ED ?Medications - No data to display ? ?Initial Impression and Plan ? Patient with ongoing foot pain.  Unclear etiology, does not appear infected, normal vitals. I personally viewed the images from radiology studies and agree with radiologist interpretation: Xray negative. Recommend outpatient podiatry follow up for definitive care. Rx for Naprosyn as needed.  ? ? ?ED Course  ? ?  ? ? ?MDM Rules/Calculators/A&P ?Medical Decision Making ?Problems Addressed: ?Left foot pain: chronic illness or injury with exacerbation, progression, or side effects of treatment ? ?Amount and/or Complexity of Data Reviewed ?Radiology: ordered and independent interpretation performed. Decision-making details documented in ED Course. ? ?Risk ?Prescription drug management. ? ? ? ?Final Clinical Impression(s) / ED Diagnoses ?Final diagnoses:  ?Left foot pain  ? ? ?Rx / DC Orders ?ED Discharge Orders   ? ?      Ordered  ?  naproxen (NAPROSYN) 500 MG tablet  2 times daily       ? 08/06/21 0328  ? ?  ?  ? ?  ? ?  ?Pollyann Savoy, MD ?08/06/21 [phone] ? ?

## 2021-08-07 ENCOUNTER — Telehealth (HOSPITAL_BASED_OUTPATIENT_CLINIC_OR_DEPARTMENT_OTHER): Payer: Self-pay | Admitting: Emergency Medicine

## 2021-08-07 MED ORDER — NAPROXEN 500 MG PO TABS: ORAL_TABLET | ORAL | 0 refills | Status: DC

## 2021-08-07 NOTE — Telephone Encounter (Signed)
Pt indicates pharmacy did not get the rx for naproxen and requests it be resent.  ?

## 2023-06-23 ENCOUNTER — Emergency Department (HOSPITAL_COMMUNITY)

## 2023-06-23 ENCOUNTER — Encounter (HOSPITAL_COMMUNITY): Payer: Self-pay | Admitting: Emergency Medicine

## 2023-06-23 ENCOUNTER — Emergency Department (HOSPITAL_COMMUNITY)
Admission: EM | Admit: 2023-06-23 | Discharge: 2023-06-24 | Disposition: A | Discharge status: Home / Self Care | Attending: Emergency Medicine | Admitting: Emergency Medicine

## 2023-06-23 ENCOUNTER — Other Ambulatory Visit: Payer: Self-pay

## 2023-06-23 DIAGNOSIS — R059 Cough, unspecified: Secondary | ICD-10-CM | POA: Insufficient documentation

## 2023-06-23 DIAGNOSIS — R079 Chest pain, unspecified: Secondary | ICD-10-CM | POA: Diagnosis not present

## 2023-06-23 DIAGNOSIS — R0602 Shortness of breath: Secondary | ICD-10-CM | POA: Insufficient documentation

## 2023-06-23 DIAGNOSIS — R0789 Other chest pain: Secondary | ICD-10-CM | POA: Insufficient documentation

## 2023-06-23 DIAGNOSIS — R062 Wheezing: Secondary | ICD-10-CM | POA: Diagnosis not present

## 2023-06-23 DIAGNOSIS — R1010 Upper abdominal pain, unspecified: Secondary | ICD-10-CM | POA: Diagnosis not present

## 2023-06-23 DIAGNOSIS — R071 Chest pain on breathing: Secondary | ICD-10-CM | POA: Diagnosis not present

## 2023-06-23 LAB — I-STAT VENOUS BLOOD GAS, ED
Acid-Base Excess: 3 mmol/L — ABNORMAL HIGH (ref 0.0–2.0)
Bicarbonate: 29.1 mmol/L — ABNORMAL HIGH (ref 20.0–28.0)
Calcium, Ion: 1.14 mmol/L — ABNORMAL LOW (ref 1.15–1.40)
HCT: 45 % (ref 39.0–52.0)
Hemoglobin: 15.3 g/dL (ref 13.0–17.0)
O2 Saturation: 58 %
Potassium: 4 mmol/L (ref 3.5–5.1)
Sodium: 141 mmol/L (ref 135–145)
TCO2: 31 mmol/L (ref 22–32)
pCO2, Ven: 49.3 mmHg (ref 44–60)
pH, Ven: 7.38 (ref 7.25–7.43)
pO2, Ven: 31 mmHg — CL (ref 32–45)

## 2023-06-23 LAB — CBC
HCT: 45 % (ref 39.0–52.0)
Hemoglobin: 14 g/dL (ref 13.0–17.0)
MCH: 26.3 pg (ref 26.0–34.0)
MCHC: 31.1 g/dL (ref 30.0–36.0)
MCV: 84.6 fL (ref 80.0–100.0)
Platelets: 157 10*3/uL (ref 150–400)
RBC: 5.32 MIL/uL (ref 4.22–5.81)
RDW: 13.1 % (ref 11.5–15.5)
WBC: 10.8 10*3/uL — ABNORMAL HIGH (ref 4.0–10.5)
nRBC: 0 % (ref 0.0–0.2)

## 2023-06-23 LAB — TROPONIN I (HIGH SENSITIVITY)
Troponin I (High Sensitivity): 11 ng/L (ref <18)
Troponin I (High Sensitivity): 14 ng/L (ref <18)

## 2023-06-23 LAB — BASIC METABOLIC PANEL
Anion gap: 10 (ref 5–15)
BUN: 9 mg/dL (ref 6–20)
CO2: 26 mmol/L (ref 22–32)
Calcium: 8.8 mg/dL — ABNORMAL LOW (ref 8.9–10.3)
Chloride: 101 mmol/L (ref 98–111)
Creatinine, Ser: 1.07 mg/dL (ref 0.61–1.24)
GFR, Estimated: >60 mL/min (ref >60)
Glucose, Bld: 112 mg/dL — ABNORMAL HIGH (ref 70–99)
Potassium: 4 mmol/L (ref 3.5–5.1)
Sodium: 137 mmol/L (ref 135–145)

## 2023-06-23 LAB — I-STAT CG4 LACTIC ACID, ED: Lactic Acid, Venous: 1.7 mmol/L (ref 0.5–1.9)

## 2023-06-23 LAB — RESP PANEL BY RT-PCR (RSV, FLU A&B, COVID)  RVPGX2
Influenza A by PCR: NEGATIVE
Influenza B by PCR: NEGATIVE
Resp Syncytial Virus by PCR: NEGATIVE
SARS Coronavirus 2 by RT PCR: NEGATIVE

## 2023-06-23 LAB — BRAIN NATRIURETIC PEPTIDE: B Natriuretic Peptide: 95.5 pg/mL (ref 0.0–100.0)

## 2023-06-23 MED ORDER — ALBUTEROL SULFATE (2.5 MG/3ML) 0.083% IN NEBU
5.0000 mg | INHALATION_SOLUTION | Freq: Once | RESPIRATORY_TRACT | Status: AC
  Administered 2023-06-23: 5 mg via RESPIRATORY_TRACT
  Filled 2023-06-23: qty 6

## 2023-06-23 MED ORDER — IPRATROPIUM BROMIDE 0.02 % IN SOLN
0.5000 mg | Freq: Once | RESPIRATORY_TRACT | Status: AC
  Administered 2023-06-23: 0.5 mg via RESPIRATORY_TRACT
  Filled 2023-06-23: qty 2.5

## 2023-06-23 MED ORDER — IOHEXOL 350 MG/ML SOLN
75.0000 mL | Freq: Once | INTRAVENOUS | Status: AC | PRN
  Administered 2023-06-23: 75 mL via INTRAVENOUS

## 2023-06-23 MED ORDER — ACETAMINOPHEN 500 MG PO TABS
1000.0000 mg | ORAL_TABLET | Freq: Once | ORAL | Status: AC
  Administered 2023-06-23: 1000 mg via ORAL
  Filled 2023-06-23: qty 2

## 2023-06-23 MED ORDER — METHYLPREDNISOLONE SODIUM SUCC 125 MG IJ SOLR
125.0000 mg | Freq: Once | INTRAMUSCULAR | Status: AC
  Administered 2023-06-23: 125 mg via INTRAVENOUS
  Filled 2023-06-23: qty 2

## 2023-06-23 NOTE — ED Provider Notes (Signed)
 [place] EMERGENCY DEPARTMENT AT [facility] Provider Note   CSN: [id number] Arrival date & time: 06/23/23  1702     History {Add pertinent medical, surgical, social history, OB history to HPI:1} Chief Complaint  Patient presents with   Shortness of Breath    Ian Sparks is a 53 y.o. male.  Patient is a 53 year old male with no known medical history except for a heart murmur at birth who is presenting today with complaints of shortness of breath.  Patient reports since Sunday he has not felt great.  Had some malaise and myalgias with a little bit of a scratchy throat and a cough but reports at work today the shortness of breath just kept getting worse.  He is now wheezing and having a productive cough.  He reports never having any issues with breathing in the past.  He does not use inhalers or smoke cigarettes.  He also reports tightness throughout his chest and in his upper abdomen.  No nausea vomiting or diarrhea.  He always gets a little swelling in his lower legs but nothing that has seemed different from baseline.  The history is provided by the patient and the spouse.  Shortness of Breath      Home Medications Prior to Admission medications   Medication Sig Start Date End Date Taking? Authorizing Provider  naproxen (NAPROSYN) 500 MG tablet Take one tablet every 12 hours as need for pain. Take with food. 08/07/21   Cathren Laine, MD      Allergies    Aspirin    Review of Systems   Review of Systems  Respiratory:  Positive for shortness of breath.     Physical Exam Updated Vital Signs BP (!) 145/86 (BP Location: Right Arm)   Pulse 91   Temp 99.9 F (37.7 C)   Resp (!) 30   SpO2 94%  Physical Exam Vitals and nursing note reviewed.  Constitutional:      Appearance: He is well-developed.  HENT:     Head: Normocephalic and atraumatic.     Mouth/Throat:     Mouth: Mucous membranes are moist.  Eyes:     Conjunctiva/sclera: Conjunctivae normal.      Pupils: Pupils are equal, round, and reactive to light.  Cardiovascular:     Rate and Rhythm: Normal rate and regular rhythm.     Heart sounds: No murmur heard. Pulmonary:     Effort: Pulmonary effort is normal. Tachypnea present. No respiratory distress.     Breath sounds: Wheezing present. No rales.  Abdominal:     General: There is no distension.     Palpations: Abdomen is soft.     Tenderness: There is no abdominal tenderness. There is no guarding or rebound.  Musculoskeletal:        General: No tenderness. Normal range of motion.     Cervical back: Normal range of motion and neck supple.     Right lower leg: Edema present.     Left lower leg: Edema present.     Comments: 1+ pitting edema bilateral ankles  Skin:    General: Skin is warm and dry.     Findings: No erythema or rash.  Neurological:     Mental Status: He is alert and oriented to person, place, and time. Mental status is at baseline.  Psychiatric:        Behavior: Behavior normal.     ED Results / Procedures / Treatments   Labs (all labs ordered are  listed, but only abnormal results are displayed) Labs Reviewed  CBC - Abnormal; Notable for the following components:      Result Value   WBC 10.8 (*)    All other components within normal limits  I-STAT VENOUS BLOOD GAS, ED - Abnormal; Notable for the following components:   pO2, Ven 31 (*)    Bicarbonate 29.1 (*)    Acid-Base Excess 3.0 (*)    Calcium, Ion 1.14 (*)    All other components within normal limits  RESP PANEL BY RT-PCR (RSV, FLU A&B, COVID)  RVPGX2  BASIC METABOLIC PANEL  BRAIN NATRIURETIC PEPTIDE  I-STAT CG4 LACTIC ACID, ED  TROPONIN I (HIGH SENSITIVITY)    EKG EKG Interpretation Date/Time:  Tuesday June 23 2023 17:08:25 EST Ventricular Rate:  96 PR Interval:  154 QRS Duration:  142 QT Interval:  372 QTC Calculation: 469 R Axis:   -78  Text Interpretation: Normal sinus rhythm Left axis deviation Right bundle branch block When  compared with ECG of 06-Apr-2021 18:24, PREVIOUS ECG IS PRESENT No significant change since last tracing Confirmed by Gwyneth Sprout ([id number]) on 06/23/2023 5:51:26 PM  Radiology No results found.  Procedures Procedures  {Document cardiac monitor, telemetry assessment procedure when appropriate:1}  Medications Ordered in ED Medications  albuterol (PROVENTIL) (2.5 MG/3ML) 0.083% nebulizer solution 5 mg (has no administration in time range)  ipratropium (ATROVENT) nebulizer solution 0.5 mg (has no administration in time range)    ED Course/ Medical Decision Making/ A&P   {   Click here for ABCD2, HEART and other calculatorsREFRESH Note before signing :1}                              Medical Decision Making Amount and/or Complexity of Data Reviewed Labs: ordered. Radiology: ordered.  Risk OTC drugs. Prescription drug management.   Pt  presenting today with a complaint that caries a high risk for morbidity and mortality. Here today with shortness of breath.  Patient has audible wheezing, tachypnea but sats are 94% on room air.  Also patient has a temperature of 99.9 with a productive cough.  Concern for viral URI with bronchitis versus pneumonia versus CHF with fluid overload.  Patient has no prior cardiac or lung history.  Will give albuterol and Atrovent and reassess.  I independently interpreted patient's labs and EKG.  EKG with a right bundle branch block which is unchanged from prior, VBG within normal limits, lactic acid is normal, CBC with mild leukocytosis of 10, troponin, BNP, BNP are all within normal limits  I have independently visualized and interpreted pt's images today.  Chest x-ray without acute findings.  7:36 PM After albuterol wheezing has significantly improved although patient is still a bit winded.  He was given Solu-Medrol and will reassess.  {Document critical care time when appropriate:1} {Document review of labs and clinical decision tools ie heart score,  Chads2Vasc2 etc:1}  {Document your independent review of radiology images, and any outside records:1} {Document your discussion with family members, caretakers, and with consultants:1} {Document social determinants of health affecting pt's care:1} {Document your decision making why or why not admission, treatments were needed:1} Final Clinical Impression(s) / ED Diagnoses Final diagnoses:  None    Rx / DC Orders ED Discharge Orders     None

## 2023-06-23 NOTE — ED Provider Triage Note (Signed)
 Emergency Medicine Provider Triage Evaluation Note  Ian Sparks , a 53 y.o. male  was evaluated in triage.  Pt complains of shortness of breath sudden onset today around 7 AM he denies any history of the same.  Diffuse wheezing difficulty moving air pain across his entire chest and upper abdomen this is all brand-new no history of the same.  Review of Systems  Positive: Chest pain shortness of breath Negative: Increased weight gain or swelling  Physical Exam  BP (!) 145/86 (BP Location: Right Arm)   Pulse 91   Temp 99.9 F (37.7 C)   Resp (!) 30   SpO2 94%  Gen:   Awake, no distress   Resp:  Speaking in shortened fragmented sentences, diffuse expiratory wheezes with prolonged extra expiratory phase, tachypnea MSK:   Moves extremities without difficulty  Other:  Diffuse wheezing   Medical Decision Making  Medically screening exam initiated at 5:30 PM.  Appropriate orders placed.  Ian Sparks was informed that the remainder of the evaluation will be completed by another provider, this initial triage assessment does not replace that evaluation, and the importance of remaining in the ED until their evaluation is complete.  Patient needs a room   Arthor Captain, [place] 06/23/23 1731

## 2023-06-23 NOTE — ED Triage Notes (Signed)
 Pt reports SHOB since 0700 this morning. Denies hx of COPD or CHF. Denies any known fevers, cough, or congestion. Increased work of breathing noted on arrival.

## 2023-06-23 NOTE — ED Notes (Signed)
 Patient transported to CT

## 2023-06-24 MED ORDER — PREDNISONE 10 MG PO TABS: 40.0000 mg | ORAL_TABLET | Freq: Every day | ORAL | 0 refills | Status: AC

## 2023-06-24 NOTE — Discharge Instructions (Addendum)
 While you are in the emergency room, your blood work done that was normal.  Your CT scan of your chest was also normal.  I have sent your prescription for a few days of steroids which should help out with the breathing issues that you have been having.  Please follow-up with your primary care doctor.

## 2023-06-24 NOTE — ED Provider Notes (Signed)
  Physical Exam  BP 128/65   Pulse 82   Temp 98.5 F (36.9 C) (Oral)   Resp (!) 21   SpO2 95%   Physical Exam  Procedures  Procedures  ED Course / MDM    Medical Decision Making Amount and/or Complexity of Data Reviewed Labs: ordered. Radiology: ordered.  Risk OTC drugs. Prescription drug management.   Wardell Honour, assumed care for this patient.  In brief 53 year old male presenting to the emergency room for shortness of breath.  Patient was signed out pending CTA.  Symptoms improved with breathing treatment, steroids. Patient has been ambulatory, satting 95% on ambulation.  Will discharge patient with prednisone.      Anders Simmonds T, DO 06/24/23 Aretha Parrot
